# Patient Record
Sex: Female | Born: 1990 | Race: White | Hispanic: No | Marital: Married | Smoking: Never smoker
Health system: Southern US, Community
[De-identification: ages and names within clinical notes are randomized; demographics above are authoritative.]

## PROBLEM LIST (undated history)

## (undated) DIAGNOSIS — N3000 Acute cystitis without hematuria: Principal | ICD-10-CM

## (undated) DIAGNOSIS — R350 Frequency of micturition: Principal | ICD-10-CM

## (undated) DIAGNOSIS — B379 Candidiasis, unspecified: Secondary | ICD-10-CM

## (undated) DIAGNOSIS — R110 Nausea: Principal | ICD-10-CM

## (undated) DIAGNOSIS — N760 Acute vaginitis: Principal | ICD-10-CM

## (undated) DIAGNOSIS — M542 Cervicalgia: Secondary | ICD-10-CM

## (undated) DIAGNOSIS — K829 Disease of gallbladder, unspecified: Secondary | ICD-10-CM

## (undated) DIAGNOSIS — R079 Chest pain, unspecified: Secondary | ICD-10-CM

## (undated) DIAGNOSIS — R0602 Shortness of breath: Secondary | ICD-10-CM

## (undated) DIAGNOSIS — R002 Palpitations: Secondary | ICD-10-CM

## (undated) DIAGNOSIS — M25559 Pain in unspecified hip: Secondary | ICD-10-CM

## (undated) DIAGNOSIS — R1310 Dysphagia, unspecified: Secondary | ICD-10-CM

## (undated) DIAGNOSIS — K219 Gastro-esophageal reflux disease without esophagitis: Secondary | ICD-10-CM

## (undated) DIAGNOSIS — E559 Vitamin D deficiency, unspecified: Secondary | ICD-10-CM

## (undated) DIAGNOSIS — I429 Cardiomyopathy, unspecified: Secondary | ICD-10-CM

## (undated) DIAGNOSIS — M1990 Unspecified osteoarthritis, unspecified site: Secondary | ICD-10-CM

## (undated) DIAGNOSIS — F419 Anxiety disorder, unspecified: Secondary | ICD-10-CM

## (undated) DIAGNOSIS — E739 Lactose intolerance, unspecified: Secondary | ICD-10-CM

## (undated) DIAGNOSIS — E282 Polycystic ovarian syndrome: Secondary | ICD-10-CM

## (undated) DIAGNOSIS — Z91018 Allergy to other foods: Secondary | ICD-10-CM

## (undated) DIAGNOSIS — K5900 Constipation, unspecified: Secondary | ICD-10-CM

## (undated) DIAGNOSIS — R32 Unspecified urinary incontinence: Secondary | ICD-10-CM

## (undated) DIAGNOSIS — E785 Hyperlipidemia, unspecified: Secondary | ICD-10-CM

## (undated) DIAGNOSIS — E669 Obesity, unspecified: Secondary | ICD-10-CM

## (undated) DIAGNOSIS — I38 Endocarditis, valve unspecified: Secondary | ICD-10-CM

## (undated) DIAGNOSIS — N6452 Nipple discharge: Secondary | ICD-10-CM

## (undated) DIAGNOSIS — M549 Dorsalgia, unspecified: Secondary | ICD-10-CM

## (undated) DIAGNOSIS — M2550 Pain in unspecified joint: Secondary | ICD-10-CM

## (undated) DIAGNOSIS — F32A Depression, unspecified: Secondary | ICD-10-CM

## (undated) DIAGNOSIS — I771 Stricture of artery: Secondary | ICD-10-CM

## (undated) DIAGNOSIS — K259 Gastric ulcer, unspecified as acute or chronic, without hemorrhage or perforation: Secondary | ICD-10-CM

## (undated) DIAGNOSIS — E119 Type 2 diabetes mellitus without complications: Secondary | ICD-10-CM

## (undated) DIAGNOSIS — B019 Varicella without complication: Secondary | ICD-10-CM

## (undated) DIAGNOSIS — K760 Fatty (change of) liver, not elsewhere classified: Secondary | ICD-10-CM

## (undated) DIAGNOSIS — Z8739 Personal history of other diseases of the musculoskeletal system and connective tissue: Secondary | ICD-10-CM

## (undated) DIAGNOSIS — K589 Irritable bowel syndrome without diarrhea: Secondary | ICD-10-CM

## (undated) HISTORY — DX: Cervicalgia: M54.2

## (undated) HISTORY — DX: Dorsalgia, unspecified: M54.9

## (undated) HISTORY — DX: Unspecified osteoarthritis, unspecified site: M19.90

## (undated) HISTORY — DX: Shortness of breath: R06.02

## (undated) HISTORY — DX: Constipation, unspecified: K59.00

## (undated) HISTORY — DX: Gastro-esophageal reflux disease without esophagitis: K21.9

## (undated) HISTORY — DX: Lactose intolerance, unspecified: E73.9

## (undated) HISTORY — DX: Gastric ulcer, unspecified as acute or chronic, without hemorrhage or perforation: K25.9

## (undated) HISTORY — DX: Irritable bowel syndrome, unspecified: K58.9

## (undated) HISTORY — DX: Disease of gallbladder, unspecified: K82.9

## (undated) HISTORY — DX: Vitamin D deficiency, unspecified: E55.9

## (undated) HISTORY — DX: Unspecified urinary incontinence: R32

## (undated) HISTORY — DX: Type 2 diabetes mellitus without complications: E11.9

## (undated) HISTORY — DX: Depression, unspecified: F32.A

## (undated) HISTORY — DX: Dysphagia, unspecified: R13.10

## (undated) HISTORY — DX: Pain in unspecified joint: M25.50

## (undated) HISTORY — DX: Personal history of other diseases of the musculoskeletal system and connective tissue: Z87.39

## (undated) HISTORY — DX: Hyperlipidemia, unspecified: E78.5

## (undated) HISTORY — PX: NO PAST SURGERIES: SHX2092

## (undated) HISTORY — DX: Palpitations: R00.2

## (undated) HISTORY — DX: Polycystic ovarian syndrome: E28.2

## (undated) HISTORY — DX: Chest pain, unspecified: R07.9

## (undated) HISTORY — DX: Pain in unspecified hip: M25.559

## (undated) HISTORY — DX: Allergy to other foods: Z91.018

## (undated) HISTORY — DX: Varicella without complication: B01.9

## (undated) HISTORY — DX: Obesity, unspecified: E66.9

## (undated) HISTORY — DX: Endocarditis, valve unspecified: I38

## (undated) HISTORY — DX: Anxiety disorder, unspecified: F41.9

---

## 2012-01-28 LAB — CULTURE, HSV

## 2013-03-07 ENCOUNTER — Inpatient Hospital Stay
Admit: 2013-03-07 | Discharge: 2013-03-07 | Disposition: A | Discharge status: Home / Self Care | Attending: Emergency Medicine

## 2013-03-07 MED ORDER — IBUPROFEN 800 MG PO TABS
800 MG | ORAL_TABLET | Freq: Three times a day (TID) | ORAL | Status: DC | PRN
Start: 2013-03-07 — End: 2014-02-10

## 2013-03-07 MED ORDER — OXYCODONE-ACETAMINOPHEN 5-325 MG PO TABS
5-325 MG | ORAL_TABLET | Freq: Four times a day (QID) | ORAL | Status: DC | PRN
Start: 2013-03-07 — End: 2014-02-10

## 2013-03-07 MED ORDER — SULFAMETHOXAZOLE-TRIMETHOPRIM 800-160 MG PO TABS
800-160 MG | ORAL_TABLET | Freq: Two times a day (BID) | ORAL | Status: AC
Start: 2013-03-07 — End: 2013-03-14

## 2013-03-07 MED ORDER — DOXYCYCLINE MONOHYDRATE 100 MG PO TABS
100 MG | ORAL_TABLET | Freq: Two times a day (BID) | ORAL | Status: AC
Start: 2013-03-07 — End: 2013-03-21

## 2013-03-07 MED ORDER — FLUCONAZOLE 150 MG PO TABS
150 MG | ORAL_TABLET | ORAL | Status: DC
Start: 2013-03-07 — End: 2013-10-27

## 2013-03-07 NOTE — ED Provider Notes (Signed)
[place]  eMERGENCY dEPARTMENT eNCOUnter      Pt Name: Kara Freeman  MRN: [id number]  [place] 10/03/1991  Date of evaluation: 03/07/2013  Provider: Cherre Blanc, MD    CHIEF COMPLAINT       Chief Complaint   Patient presents with   ??? Foot Injury     fell injured right foot         HISTORY OF PRESENT ILLNESS  (Location/Symptom, Timing/Onset, Context/Setting, Quality, Duration, Modifying Factors, Severity.)   Kara Freeman is a 22 y.o. female who presents to the emergency department with complaint of right ankle and foot pain.  She states discomfort started yesterday.  She was wearing her ""shoots" when she tripped over a rock.  She sustained a lateral inversion injury to the ankle.  She however has pain and swelling and redness over the dorsum of the foot more suggestive of a tongue, didn't cellulitis.  Increasing pain focal to this area.  She denies any other injury.       Nursing Notes were reviewed.    ALLERGIES     Review of patient's allergies indicates no known allergies.    CURRENT MEDICATIONS       Previous Medications    No medications on file       PAST MEDICAL HISTORY   History reviewed. No pertinent past medical history.    SURGICAL HISTORY     History reviewed. No pertinent past surgical history.      FAMILY HISTORY     History reviewed. No pertinent family history.  No family status information on file.        SOCIAL HISTORY      reports that she has never smoked. She does not have any smokeless tobacco history on file. She reports that she does not drink alcohol or use illicit drugs.    REVIEW OF SYSTEMS    (2-9 systems for level 4, 10 or more for level 5)   Review of Systems   HENT: Negative.    Respiratory: Negative.    Cardiovascular: Negative.    Genitourinary: Negative.    Musculoskeletal: Positive for arthralgias and gait problem.   Neurological: Negative.    Hematological: Negative.    All other systems reviewed and are negative.         Except as noted above the  remainder of the review of systems was reviewed and negative.     PHYSICAL EXAM    (up to 7 for level 4, 8 or more for level 5)   ED Triage Vitals   BP Temp Temp Source Pulse Resp SpO2 Height Weight   03/07/13 1017 03/07/13 1013 03/07/13 1013 03/07/13 1013 03/07/13 1013 03/07/13 1013 -- 03/07/13 1013   134/78 mmHg 98.2 ??F (36.8 ??C) Oral 99 18 99 %  218 lb (98.884 kg)       Physical exam reflects a well-nourished well-hydrated female who is afebrile with otherwise stable vital signs to include pulse ox 99% on room air.  She's not hypoxic.  She is alert conversive and appropriate in behavior.  No evidence of or complaint of injury to the face head neck chest abdomen bilateral upper extremities or left lower extremity with regard to the right foot the dorsum of the foot is erythematous hot and tender.  No obvious deformity or specific point point tenderness.  No neurovascular deficits.  Achilles reflex is intact.  No ankle discomfort with regard  To the medial or lateral malleolus.  No  proximal tibia or fibular discomfort no evidence of any hip or pelvic injury.      DIAGNOSTIC RESULTS       RADIOLOGY:   Non-plain film images such as CT, Ultrasound and MRI are read by the radiologist. Plain radiographic images are visualized and preliminarily interpreted by the emergency physician with the below findings:    Films appear negative for fracture dislocation retained foreign body or other acute traumatic injury to the foot or ankle    Interpretation per the Radiologist below, if available at the time of this note:    XR FOOT RIGHT STANDARD    (Results Pending)             EMERGENCY DEPARTMENT COURSE and DIFFERENTIAL DIAGNOSIS/MDM:   Vitals:    Filed Vitals:    03/07/13 1013 03/07/13 1017   BP:  134/78   Pulse: 99    Temp: 98.2 ??F (36.8 ??C)    TempSrc: Oral    Resp: 18    Weight: 218 lb (98.884 kg)    SpO2: 99%        Imaging studies are reviewed and are negative for traumatic injury.  Ankle is protected with air splint  with regard to ankle sprain injury component of her discomfort.  I suspect however that she has a component of cellulitis over the dorsum of her foot.  She'll be placed on antibiotic coverage with regard to this.  She's discharged home on conservative management for discomfort and inflammation as well as oral antibiotic coverage for presumed cellulitis.  CONSULTS:  None    PROCEDURES:  None    FINAL IMPRESSION      1. Ankle sprain and strain, right, initial encounter    2. Cellulitis and abscess of leg          DISPOSITION/PLAN   DISPOSITION Decision to Discharge    PATIENT REFERRED TO:   [facility] [place] [facility]  [place] [address]    Schedule an appointment as soon as possible for a visit in 3 days        DISCHARGE MEDICATIONS:     New Prescriptions    DOXYCYCLINE (ADOXA) 100 MG TABLET    Take 1 tablet by mouth 2 times daily for 14 days.    IBUPROFEN (ADVIL;MOTRIN) 800 MG TABLET    Take 1 tablet by mouth every 8 hours as needed for Pain.    OXYCODONE-ACETAMINOPHEN (PERCOCET) 5-325 MG PER TABLET    Take 1-2 tablets by mouth every 6 hours as needed for Pain.    SULFAMETHOXAZOLE-TRIMETHOPRIM (BACTRIM DS) 800-160 MG PER TABLET    Take 1 tablet by mouth 2 times daily for 7 days.           (Please note that portions of this note were completed with a voice recognition program.  Efforts were made to edit the dictations but occasionally words are mis-transcribed.)    Cherre Blanc, MD  Attending Emergency Physician          Cherre Blanc, MD  03/07/13 1056

## 2013-03-07 NOTE — Discharge Instructions (Signed)
Ankle Sprain  An ankle sprain happens when the bands of tissue that hold the ankle bones together (ligaments) stretch too much and tear.   HOME CARE    Put ice on the ankle for 15 to 20 minutes, 3 to 4 times a day.   Put ice in a plastic bag.   Place a towel between your skin and the bag.   You may stop icing when the puffiness (swelling) goes down.   Raise (elevate) the injured ankle to lessen puffiness.   Use crutches if your doctor tells you to. Use them until you can walk without pain.   If a plaster splint was applied:   Rest the plaster splint on nothing harder than a pillow for 24 hours.   Do not put weight on it.   Do not get it wet.   Take it off to shower or bathe.   Follow up with your doctor.   Use an elastic wrap for support. Take the wrap off if the toes lose feeling (numb), tingle, or turn cold or blue.   If an air splint was applied:   Add or release air to make it comfortable.   Take it off at night and to shower and bathe.   Wiggle your toes while wearing the air splint.   Only take medicine as told by your doctor.   Do not drive until your doctor says it is okay.  GET HELP RIGHT AWAY IF:    You have more bruising, puffiness, or pain.   Your toes feel cold.   Your medicine does not help lessen your pain.   You are losing feeling in your toes or they turn blue.   You have severe pain.  MAKE SURE YOU:    Understand these instructions.   Will watch your condition.   Will get help right away if you are not doing well or get worse.  Document Released: 04/02/2008 Document Revised: 01/07/2012 Document Reviewed: 04/02/2008  [facility] Patient Information 2013 [place].    Cellulitis  Cellulitis is an infection of the skin and the tissue under the skin. The infected area is usually red and tender. This happens most often in the arms and lower legs.  HOME CARE    Take your antibiotic medicine as told. Finish the medicine even if you start to feel better.   Keep the infected  arm or leg raised (elevated).   Put a warm cloth on the area up to 4 times per day.   Only take medicines as told by your doctor.   Keep all doctor visits as told.  GET HELP RIGHT AWAY IF:    You have a fever.   You feel very sleepy.   You throw up (vomit) or have watery poop (diarrhea).   You feel sick and have muscle aches and pains.   You see red streaks on the skin coming from the infected area.   Your red area gets bigger or turns a dark color.   Your bone or joint under the infected area is painful after the skin heals.   Your infection comes back in the same area or different area.   You have a puffy (swollen) bump in the infected area.   You have new symptoms.  MAKE SURE YOU:    Understand these instructions.   Will watch your condition.   Will get help right away if you are not doing well or get worse.  Document Released: 04/02/2008 Document Revised:  04/15/2012 Document Reviewed: 12/31/2011  [facility] Patient Information 2013 [place].

## 2013-03-07 NOTE — ED Notes (Signed)
Pt c/o pain to rt foot after yesterday tripping over rock.has mild swelling and redness to top of rt foot.pt able to ambulate with limp.    Kathrene Alu, RN  03/07/13 [phone]

## 2013-03-07 NOTE — ED Notes (Signed)
As per Dr Erline Levine.the patient refused crutches    Lara Mulch, LPN  [id number]

## 2013-05-04 MED ORDER — SILVER SULFADIAZINE 1 % EX CREA
1 % | CUTANEOUS | Status: DC
Start: 2013-05-04 — End: 2014-02-10

## 2013-05-04 NOTE — Progress Notes (Signed)
Kara Freeman 22 y.o. female that presents complaining of a painful infected ingrown toenail on the 1st Left and Right toes. Conservative therapy has failed    Vascular: DP and PT pulses palpable Right Foot 2/4,DP and PT pulses palpable Left Foot 2/4,        Neurological:  Sensation intact to light touch to level of digits, both feet.         Musculoskeletal    Pain present upon palpation of hallux nail, Right Foot, present upon palpation of hallux nail, Left Foot..    Integument:    Warm, dry, supple, present Right Foot, present Left Foot.      Nails 1 left and 1 right thickened >  dystrophic and crumbly, discolored with subungual debris.  Ingrown toenail with infection 1st Left and Right    Assessment:   Ingrown toenail with infection 1st Left and Right    Plan: Patient has chosen permanent removal of the entire toenail. The procedure, possible complications have been discussed. Patient was informed of the recurrence rate .       Phenol and alcohol matrixectomy was performed to the offending nail. Standard foot prep was done and a  hallux digital block was performed utilizing 1% Lidocain plain. A digital tourniquet was applied to the appropriate digit. The toenail was freed from the surrounding tissue and it was completely removed. Three consecutive 30 seconds applications of 89% phenol were then applied to the nail matrix with an applicator. Next utilizing a small curette the matrix epithelium was scraped to further destroy the matrix. The entire field was then lavaged with 70% isopropyl alcohol to flush the remaining phenol from the tissue. The site was then dressed with Silvadene cream and a dry sterile dressing. The patient was educated on signs of infection both in written and verbal format. There also gave been written and oral instructions on care over the next two weeks. They were told to call with any questions, comments, or concerns.    Perm removal left 1 and temporary removal right 1Plan: The toe was  anesthestized with 5cc's of Lidocaine plain, The toe was prepped and draped in a sterile fashion. Bleeding was controlled with a toe tourniquet. The nail was removed completely to the level of the matrix. The tourniquet was released and a sterile dressing was applied. The patient tolerated the procedure well without apparent complications. Oral and written instructions were dispensed.

## 2013-05-18 NOTE — Progress Notes (Signed)
Kara Freeman is a 22 y.o. female with the chief complaint of left great ingrown post op. It has been present for 2 week(s) duration.  Review of systems  Denies n/v/f/c and calf pain  Physical Exam  General AAOx3 NAD  Lower extremity physical exam:    Vascular:  DP/PT pulses are palpable and are 2/4. Marland Kitchen No edema or varicosities.CFT brisk to all digits. Skin temperature is warm to warm.     Neurology: Sensation is normal. Reflexes are within normal limits.Sharp and dull discrimination intact    Orthopedic: Joint range of motion is normal. Muscle strength is normal. No structural deformities.    Dermatology: Inspection of skin and all tissues are normal. Nails are normal. There are no open lesions. No tenting of skin. No fracture blisters. No ecchymosis.       Assessment:    Infected ingrown nail right hallux    Plan:    Total perm. Right hallux      Phenol and alcohol matrixectomy was performed to the offending nail. Standard foot prep was done and a  hallux digital block was performed utilizing 1% Lidocain plain. A digital tourniquet was applied to the appropriate digit. The toenail was freed from the surrounding tissue and it was completely removed. Three consecutive 30 seconds applications of 89% phenol were then applied to the nail matrix with an applicator. Next utilizing a small curette the matrix epithelium was scraped to further destroy the matrix. The entire field was then lavaged with 70% isopropyl alcohol to flush the remaining phenol from the tissue. The site was then dressed with Silvadene cream and a dry sterile dressing. The patient was educated on signs of infection both in written and verbal format. There also gave been written and oral instructions on care over the next two weeks. They were told to call with any questions, comments, or concerns.

## 2013-06-12 ENCOUNTER — Inpatient Hospital Stay
Admit: 2013-06-12 | Discharge: 2013-06-12 | Disposition: A | Discharge status: Home / Self Care | Attending: Personal Emergency Response Attendant

## 2013-06-12 MED ORDER — CYCLOBENZAPRINE HCL 10 MG PO TABS
10 MG | ORAL_TABLET | Freq: Three times a day (TID) | ORAL | Status: AC | PRN
Start: 2013-06-12 — End: 2013-06-22

## 2013-06-12 NOTE — ED Provider Notes (Signed)
eMERGENCY dEPARTMENT eNCOUnter        CHIEF COMPLAINT    Chief Complaint   Patient presents with   ??? Back Pain       HPI    Kara Freeman is a 22 y.o. female who presents with complaints of back pain.  Patient was moving something and pulled her back and has pain across the lower back, not radiating anywhere.  No bowel or bladder involvement no tingling and numbness of the toes.  No abdominal pain.  No neck pain    REVIEW OF SYSTEMS    See HPI for further details. Review of systems otherwise negative.     PAST MEDICAL HISTORY    Past Medical History   Diagnosis Date   ??? Herpes        SURGICAL HISTORY    History reviewed. No pertinent past surgical history.    CURRENT MEDICATIONS    Current Outpatient Rx   Name  Route  Sig  Dispense  Refill   ??? cyclobenzaprine (FLEXERIL) 10 MG tablet    Oral    Take 1 tablet by mouth 3 times daily as needed for Muscle spasms for up to 10 days.    30 tablet    0     ??? silver sulfADIAZINE (SILVADENE) 1 % cream        Apply topically twice a day    50 g    1     ??? ibuprofen (ADVIL;MOTRIN) 800 MG tablet    Oral    Take 1 tablet by mouth every 8 hours as needed for Pain.    30 tablet    0     ??? oxyCODONE-acetaminophen (PERCOCET) 5-325 MG per tablet    Oral    Take 1-2 tablets by mouth every 6 hours as needed for Pain.    20 tablet    0     ??? fluconazole (DIFLUCAN) 150 MG tablet    Oral    Take 1 tablet by mouth every 72 hours.    3 tablet    0     ??? ValACYclovir HCl (VALTREX PO)    Oral    Take 400 mg by mouth 2 times daily as needed.                 ALLERGIES    No Known Allergies    FAMILY HISTORY    History reviewed. No pertinent family history.    SOCIAL HISTORY    History     Social History   ??? Marital Status: Single     Spouse Name: N/A     Number of Children: N/A   ??? Years of Education: N/A     Social History Main Topics   ??? Smoking status: Never Smoker    ??? Smokeless tobacco: None   ??? Alcohol Use: No   ??? Drug Use: No   ??? Sexual Activity: None     Other Topics Concern   ??? None      Social History Narrative       PHYSICAL EXAM    VITAL SIGNS: BP 133/73   Pulse 77   Temp(Src) 98.1 ??F (36.7 ??C) (Oral)   Resp 16   Ht 5\' 6"  (1.676 m)   Wt 221 lb 11.2 oz (100.562 kg)   BMI 35.8 kg/m2   SpO2 99%   Constitutional:  Well developed, well nourished, no acute distress, non-toxic appearance HENT:  Atraumatic, external ears normal, nose normal, oropharynx  moist. Neck- normal range of motion, no tenderness, supple   Respiratory:  No respiratory distress, normal breath sounds.   Cardiovascular:  Normal rate, normal rhythm, no murmurs, no gallops, no rubs   GI:  Soft, nondistended, nontender, no organomegaly, no mass, no rebound, no guarding   GU:  No costovertebral angle tenderness   Musculoskeletal:  No edema, no tenderness, no deformities.  Back- local tenderness in the lower lumbar spine noted   Integument:  Well hydrated   Neurologic:  No neuro deficit     EKG    Not clinically indicated.      RADIOLOGY/PROCEDURES    Not clinically indicated.      ED COURSE & MEDICAL DECISION MAKING    Pertinent Labs & Imaging studies reviewed. (See chart for details)  22 year old with back pain    FINAL IMPRESSION    1.  Back pain    Christie Nottingham, MD  06/12/13 [phone]

## 2013-06-12 NOTE — Discharge Instructions (Signed)
Back Pain, Adult  Low back pain is very common. About 1 in 5 people have back pain.The cause of low back pain is rarely dangerous. The pain often gets better over time.About half of people with a sudden onset of back pain feel better in just 2 weeks. About 8 in 10 people feel better by 6 weeks.   CAUSES  Some common causes of back pain include:   Strain of the muscles or ligaments supporting the spine.   Wear and tear (degeneration) of the spinal discs.   Arthritis.   Direct injury to the back.  DIAGNOSIS  Most of the time, the direct cause of low back pain is not known.However, back pain can be treated effectively even when the exact cause of the pain is unknown.Answering your caregiver's questions about your overall health and symptoms is one of the most accurate ways to make sure the cause of your pain is not dangerous. If your caregiver needs more information, he or she may order lab work or imaging tests (X-rays or MRIs).However, even if imaging tests show changes in your back, this usually does not require surgery.  HOME CARE INSTRUCTIONS  For many people, back pain returns.Since low back pain is rarely dangerous, it is often a condition that people can learn to manageon their own.    Remain active. It is stressful on the back to sit or stand in one place. Do not sit, drive, or stand in one place for more than 30 minutes at a time. Take short walks on level surfaces as soon as pain allows.Try to increase the length of time you walk each day.   Do not stay in bed.Resting more than 1 or 2 days can delay your recovery.   Do not avoid exercise or work.Your body is made to move.It is not dangerous to be active, even though your back may hurt.Your back will likely heal faster if you return to being active before your pain is gone.   Pay attention to your body when you bend and lift. Many people have less discomfortwhen lifting if they bend their knees, keep the load close to their bodies,and  avoid twisting. Often, the most comfortable positions are those that put less stress on your recovering back.   Find a comfortable position to sleep. Use a firm mattress and lie on your side with your knees slightly bent. If you lie on your back, put a pillow under your knees.   Only take over-the-counter or prescription medicines as directed by your caregiver. Over-the-counter medicines to reduce pain and inflammation are often the most helpful.Your caregiver may prescribe muscle relaxant drugs.These medicines help dull your pain so you can more quickly return to your normal activities and healthy exercise.   Put ice on the injured area.   Put ice in a plastic bag.   Place a towel between your skin and the bag.   Leave the ice on for 15-20 minutes, 3-4 times a day for the first 2 to 3 days. After that, ice and heat may be alternated to reduce pain and spasms.   Ask your caregiver about trying back exercises and gentle massage. This may be of some benefit.   Avoid feeling anxious or stressed.Stress increases muscle tension and can worsen back pain.It is important to recognize when you are anxious or stressed and learn ways to manage it.Exercise is a great option.  SEEK MEDICAL CARE IF:   You have pain that is not relieved with rest or   medicine.   You have pain that does not improve in 1 week.   You have new symptoms.   You are generally not feeling well.  SEEK IMMEDIATE MEDICAL CARE IF:    You have pain that radiates from your back into your legs.   You develop new bowel or bladder control problems.   You have unusual weakness or numbness in your arms or legs.   You develop nausea or vomiting.   You develop abdominal pain.   You feel faint.  Document Released: 10/15/2005 Document Revised: 04/15/2012 Document Reviewed: 03/05/2011  [employer] Patient Information 2014 [employer].

## 2013-06-20 ENCOUNTER — Inpatient Hospital Stay: Admit: 2013-06-20 | Discharge: 2013-06-20 | Discharge status: Against Medical Advice

## 2013-06-20 LAB — VAGINITIS DNA PROBE
Direct Exam: 407.3
Direct Exam: NEGATIVE
Direct Exam: POSITIVE — AB
Direct Exam: POSITIVE — AB

## 2013-06-20 NOTE — ED Notes (Signed)
Pt presents to er with c/o vaginal d/c with odor. Pt states she was called last week per her ob and was told she has std. Pt was treated with "2 pills". Pt states she still has s/sx. Pt a&ox3. Skin warm and dry. Respirations even and non-labored.     Charm RingsBobbie Wells Mabe, RN  06/20/13 1124

## 2013-06-20 NOTE — ED Notes (Signed)
Pt states she has to leave for work. Pt states she does not have to to wait for results. E. Lump C-NP notified.     Charm RingsBobbie Derrica Sieg, RN  06/20/13 1204

## 2013-06-20 NOTE — ED Provider Notes (Signed)
eMERGENCY dEPARTMENT eNCOUnter        [place]    Chief Complaint   Patient presents with   ??? Vaginal Discharge     past month..tx for chlamydia 2wks ago       HPI    Kara Freeman is a 22 y.o. female who presents With complaints of vaginal discharge with odor.  She was seen at her ObGyn's office and diagnosed with Chlamydia one week ago.  She took her antibiotic as prescribed but continues to have vaginal discharge with odor.  She denies any abdominal pain or nausea.  She denies any fevers at home.  No dysuria or urinary frequency.  No chest pain or shortness of breath.    REVIEW OF SYSTEMS    See HPI for further details. Review of systems otherwise negative.     PAST MEDICAL HISTORY    Past Medical History   Diagnosis Date   ??? Herpes        SURGICAL HISTORY    No past surgical history on file.    CURRENT MEDICATIONS    Current Outpatient Rx   Name  Route  Sig  Dispense  Refill   ??? ValACYclovir HCl (VALTREX PO)    Oral    Take 400 mg by mouth 2 times daily as needed.             ??? cyclobenzaprine (FLEXERIL) 10 MG tablet    Oral    Take 1 tablet by mouth 3 times daily as needed for Muscle spasms for up to 10 days.    30 tablet    0     ??? silver sulfADIAZINE (SILVADENE) 1 % cream        Apply topically twice a day    50 g    1     ??? ibuprofen (ADVIL;MOTRIN) 800 MG tablet    Oral    Take 1 tablet by mouth every 8 hours as needed for Pain.    30 tablet    0     ??? oxyCODONE-acetaminophen (PERCOCET) 5-325 MG per tablet    Oral    Take 1-2 tablets by mouth every 6 hours as needed for Pain.    20 tablet    0     ??? fluconazole (DIFLUCAN) 150 MG tablet    Oral    Take 1 tablet by mouth every 72 hours.    3 tablet    0         ALLERGIES    No Known Allergies    FAMILY HISTORY    No family history on file.    SOCIAL HISTORY    History     Social History   ??? Marital Status: Single     Spouse Name: N/A     Number of Children: N/A   ??? Years of Education: N/A     Social History Main Topics   ??? Smoking status: Never Smoker     ??? Smokeless tobacco: None   ??? Alcohol Use: No   ??? Drug Use: No   ??? Sexual Activity: None     Other Topics Concern   ??? None     Social History Narrative       PHYSICAL EXAM    VITAL SIGNS: BP 135/66   Pulse 93   Temp(Src) 98.2 ??F (36.8 ??C) (Oral)   Resp 18   Ht 5' 6"$  (1.676 m)   Wt 222 lb 3.6 oz (100.8 kg)  BMI 35.88 kg/m2   SpO2 99%  Constitutional:  Well developed, well nourished, no acute distress, non-toxic appearance   Eyes:  PERRL, conjunctiva normal   HENT:  Atraumatic, external ears normal, nose normal, oropharynx moist.  Neck- supple   Respiratory:  No respiratory distress, normal breath sounds   Cardiovascular:  Normal rate, normal rhythm   GI:  Abdomen is soft and nontender.  She has normal bowel sounds present in all 4 quadrants.   GU:  No adnexal or cervical motion tenderness.  Labia minora and vaginal walls are red and irritated without any lesions or ulcers.  She has a moderate amount of thick white discharge present  Integument:  Well hydrated       ED COURSE & MEDICAL DECISION MAKING    Pertinent Labs & Imaging studies reviewed. (See chart for details)  Urine was negative for pregnancy.  Pelvic labs were obtained however the patient received an urgent call from her employer and she had to leave AMA. Her results were not yet available.  She was unable to stay for discharge paperwork but did sign AMA paperwork with her nurse.    FINAL IMPRESSION    1.  vaginitis  2.  AMA      Sharyon Medicus, NP  06/20/13 [place], NP  06/20/13 1540

## 2013-06-22 LAB — C. TRACHOMATIS / N. GONORRHOEAE, DNA
C. trachomatis DNA: POSITIVE — AB
N. gonorrhoeae DNA: NEGATIVE

## 2013-08-30 ENCOUNTER — Inpatient Hospital Stay
Admit: 2013-08-30 | Discharge: 2013-08-30 | Disposition: A | Discharge status: Home / Self Care | Attending: Emergency Medicine

## 2013-08-30 LAB — MICROSCOPIC URINALYSIS
RBC, UA: 10 /HPF (ref 0–2)
WBC, UA: 0 /HPF (ref 0–5)

## 2013-08-30 LAB — VAGINITIS DNA PROBE
Direct Exam: 407.3
Direct Exam: NEGATIVE
Direct Exam: POSITIVE — AB
Direct Exam: POSITIVE — AB

## 2013-08-30 LAB — URINALYSIS
Bilirubin Urine: NEGATIVE
Glucose, Ur: NEGATIVE
Ketones, Urine: NEGATIVE
Nitrite, Urine: NEGATIVE
Protein, UA: NEGATIVE
Specific Gravity, UA: 1.025 (ref 1.005–1.030)
Urobilinogen, Urine: NORMAL
pH, UA: 6.5 (ref 5.0–8.0)

## 2013-08-30 LAB — POCT URINE PREGNANCY: Preg Test, Ur: NEGATIVE

## 2013-08-30 MED ORDER — DOXYCYCLINE HYCLATE 100 MG PO TABS
100 MG | ORAL_TABLET | Freq: Two times a day (BID) | ORAL | Status: DC
Start: 2013-08-30 — End: 2014-02-10

## 2013-08-30 MED ORDER — VALACYCLOVIR HCL 1 G PO TABS
1 g | ORAL_TABLET | Freq: Three times a day (TID) | ORAL | Status: AC
Start: 2013-08-30 — End: 2013-09-06

## 2013-08-30 MED ORDER — METRONIDAZOLE 500 MG PO TABS
500 MG | ORAL_TABLET | Freq: Three times a day (TID) | ORAL | Status: AC
Start: 2013-08-30 — End: 2013-09-09

## 2013-08-30 MED ORDER — FLUCONAZOLE 150 MG PO TABS
150 MG | ORAL_TABLET | Freq: Every day | ORAL | Status: DC
Start: 2013-08-30 — End: 2013-10-27

## 2013-08-30 MED ADMIN — cefTRIAXone (ROCEPHIN) injection 1 g: INTRAMUSCULAR | @ 21:00:00 | NDC 00781320885

## 2013-08-30 MED FILL — CEFTRIAXONE SODIUM 1 G IJ SOLR: 1 g | INTRAMUSCULAR | Qty: 1

## 2013-08-30 NOTE — Discharge Instructions (Signed)
Candidiasis: After Your Visit  Your Care Instructions  Candidiasis (say "kan-dih-DY-uh-sus") is a yeast infection. Yeast normally lives in your body. But it can cause problems if your body's defenses don't work as they should.  Some medicines can increase your chance of getting a yeast infection. These include antibiotics, steroids, and cancer drugs. And some diseases like AIDS and diabetes can make you more likely to get yeast infections.  There are different types of yeast infections.  Kara Freeman is a yeast infection in the mouth. It usually occurs in people with weak immune systems. It causes white patches inside the mouth and throat.  Yeast infections of the skin usually occur in skin folds where the skin stays moist. They cause red, oozing patches on your skin. Babies can get these infections under the diaper. People who often wear gloves can get them on their hands.  Many women get vaginal yeast infections. They are most common when women take antibiotics. These infections can cause the vagina to itch and burn. They also cause white discharge that looks like cottage cheese.  In rare cases, yeast infects the blood. This can cause serious disease. This kind of infection is treated with medicine given through a needle into a vein (IV).  After you start treatment, a yeast infection usually goes away quickly. But if your immune system is weak, the infection may come back. Tell your doctor if you get yeast infections often.  Follow-up care is a key part of your treatment and safety. Be sure to make and go to all appointments, and call your doctor if you are having problems. It's also a good idea to know your test results and keep a list of the medicines you take.  How can you care for yourself at home?   Take your medicines exactly as prescribed. Call your doctor if you think you are having a problem with your medicine.   Use antibiotics only as directed by your doctor.   Eat yogurt with live cultures. It has  bacteria called lactobacillus. It may help prevent some types of yeast infections.   Keep your skin clean and dry. Put powder on moist places.   If you are using a cream or suppository to treat a vaginal yeast infection, don't use condoms or a diaphragm. Use a different type of birth control.   Eat a healthy diet and get regular exercise. This will help keep your immune system strong.  When should you call for help?  Call your doctor now or seek immediate medical care if:   You have a fever.   You are pregnant and have signs of a vaginal or urinary tract infection such as:   Severe itching in your vagina.   Pain during sex or when you urinate.   Unusual discharge from your vagina.   A frequent urge to urinate.   Urine that is cloudy or smells bad.  Watch closely for changes in your health, and be sure to contact your doctor if:   You do not get better as expected.   Where can you learn more?   Go to [email or web address] and sign in to your MyChart account. Enter [phone] in the [place] box to learn more about "Candidiasis: After Your Visit."    If you do not have an account, please click on the "Sign Up Now" link.      2006-2014 Healthwise, Incorporated. Care instructions adapted under license by [facility]. This care instruction is for use with  your licensed healthcare professional. If you have questions about a medical condition or this instruction, always ask your healthcare professional. [place] any warranty or liability for your use of this information.  Content Version: 10.1.311062; Current as of: January 07, 2013                Vaginitis: After Your Visit  Your Care Instructions  Vaginitis is soreness or infection of the vagina. This common problem can cause itching and burning. And it can cause a change in vaginal discharge. Sometimes it can cause pain during sex. Vaginitis may be caused by bacteria, yeast, or other germs.  Some infections that cause it are caught from a sexual partner. Bath products, spermicides, and douches can irritate the vagina too.  Some women have this problem during and after menopause. A drop in estrogen levels during this time can cause dryness, soreness, and pain during sex.  Your doctor can give you medicine to treat an infection. And home care may help you feel better. For certain types of infections, your sex partner must be treated too.  Follow-up care is a key part of your treatment and safety. Be sure to make and go to all appointments, and call your doctor if you are having problems. It's also a good idea to know your test results and keep a list of the medicines you take.  How can you care for yourself at home?   If your doctor prescribed antibiotics, take them as directed. Do not stop taking them just because you feel better. You need to take the full course of antibiotics.   Take your medicines exactly as prescribed. Call your doctor if you think you are having a problem with your medicine.   Do not eat or drink anything that has alcohol if you are taking metronidazole (Flagyl).   If you have a yeast infection, use over-the-counter products as your doctor tells you to. Or take medicine your doctor prescribes exactly as directed.   Wash your vaginal area daily with water. You also can use a mild, unscented soap if you want.   Do not use scented bath products. And do not use vaginal sprays or douches.   Put a washcloth soaked in cool water on the area to relieve itching. Or you can take cool baths.   If you have dryness because of menopause, use estrogen cream or pills that your doctor prescribes.   Ask your doctor about when it is okay to have sex.   Use a personal lubricant before sex if you have dryness. Examples are Astroglide, K-Y Jelly, and Wet Lubricant Gel.   Ask your doctor if your sex partner also needs treatment.  When should you call for help?  Call your doctor now or seek  immediate medical care if:   You have a fever and pelvic pain.  Watch closely for changes in your health, and be sure to contact your doctor if:   You have bleeding other than your period.   You do not get better as expected.   Where can you learn more?   Go to [email or web address] and sign in to your MyChart account. Enter [phone] in the [place] box to learn more about "Vaginitis: After Your Visit."    If you do not have an account, please click on the "Sign Up Now" link.      2006-2014 Healthwise, Incorporated. Care instructions adapted under license by [facility]. This care instruction is for use  with your licensed healthcare professional. If you have questions about a medical condition or this instruction, always ask your healthcare professional. [place] any warranty or liability for your use of this information.  Content Version: 10.1.311062; Current as of: January 07, 2013

## 2013-08-30 NOTE — ED Notes (Signed)
"  I'm having a breakout of herpes" "doubled up on valtrex also taking diflucan and using monistat" continues with vaginal itching. No abd pain or urinary c/o. abd obese soft. Denies fever.    Candelaria Celeste, RN  08/30/13 [phone]

## 2013-08-30 NOTE — ED Provider Notes (Signed)
[facility] EMERGENCY DEPARTMENT  eMERGENCY dEPARTMENT eNCOUnter      Pt Name: Kara Freeman  MRN: [id number]  Birthdate January 21, 1991  Date of evaluation: 08/30/2013  Provider: Fabian Sharp, MD    CHIEF COMPLAINT       Chief Complaint   Patient presents with   ??? Vaginal Itching         HISTORY OF PRESENT ILLNESS  (Location/Symptom, Timing/Onset, Context/Setting, Quality, Duration, Modifying Factors, Severity.)   Kara Freeman is a 22 y.o. female who presents to the emergency department with complaint of vaginal discharge and discomfort.  Patient has been treatment for vaginitis and herpes.  She's been taking Diflucan although last dose was 3 weeks ago.  She has been on Valtrex.  She is utilized Chief of Staff.  Her symptoms continued to worsen       Nursing Notes were reviewed.    ALLERGIES     Review of patient's allergies indicates no known allergies.    CURRENT MEDICATIONS       Previous Medications    FLUCONAZOLE (DIFLUCAN) 150 MG TABLET    Take 1 tablet by mouth every 72 hours.    IBUPROFEN (ADVIL;MOTRIN) 800 MG TABLET    Take 1 tablet by mouth every 8 hours as needed for Pain.    OXYCODONE-ACETAMINOPHEN (PERCOCET) 5-325 MG PER TABLET    Take 1-2 tablets by mouth every 6 hours as needed for Pain.    SILVER SULFADIAZINE (SILVADENE) 1 % CREAM    Apply topically twice a day    VALACYCLOVIR HCL (VALTREX PO)    Take 400 mg by mouth 2 times daily as needed.       PAST MEDICAL HISTORY         Diagnosis Date   ??? Herpes        SURGICAL HISTORY     History reviewed. No pertinent past surgical history.      FAMILY HISTORY     History reviewed. No pertinent family history.  No family status information on file.        SOCIAL HISTORY      reports that she has never smoked. She does not have any smokeless tobacco history on file. She reports that she does not drink alcohol or use illicit drugs.    REVIEW OF SYSTEMS    (2-9 systems for level 4, 10 or more for level 5)     Review of Systems   Constitutional: Negative.    HENT: Negative.     Respiratory: Negative.    Cardiovascular: Negative.    Gastrointestinal: Negative.    Genitourinary: Positive for vaginal discharge and vaginal pain.   Neurological: Negative.    Hematological: Negative.         Except as noted above the remainder of the review of systems was reviewed and negative.     PHYSICAL EXAM    (up to 7 for level 4, 8 or more for level 5)     Filed Vitals:    08/30/13 1537   BP: 129/73   Pulse: 89   Temp: 97.7 ??F (36.5 ??C)   TempSrc: Oral   Resp: 16   Weight: 221 lb 12.5 oz (100.6 kg)   SpO2: 98%       Physical exam reflects a well-nourished well-hydrated female who is afebrile with otherwise stable vital signs to include pulse ox 98% on room air.  She is not hypoxic.  She is alert conversive and appropriate behavior.  Oropharyngeal exam without lesion.  Heart regular rate and rhythm normal S1-S2 no murmurs rubs or gallops.  Lungs are clear to auscultation without wheezes rales or rhonchi.  Abdominal exam is benign soft throughout no focal tenderness rebound or guarding.  Pelvic exam shows significant erythema and warmth to the labial tissue.  She has a thick vaginal discharge noted, she does have a curdish component consistent with yeast.  I do not appreciate vesicular lesions.  She does have cervical motion tenderness.  No adnexal enlargement or tenderness noted      DIAGNOSTIC RESULTS         LABS:  Labs Reviewed   URINE CULTURE CLEAN CATCH   VAGINITIS DNA PROBE   C. TRACHOMATIS / N. GONORRHOEAE, DNA PROBE   URINALYSIS   POCT URINE PREGNANCY     Results for Kara Freeman (MRN [id number]) as of 08/30/2013 16:07   Ref. Range 08/30/2013 15:59 08/30/2013 16:00   Pregnancy, Urine No range found negative      All other labs were within normal range or not returned as of this dictation.    EMERGENCY DEPARTMENT COURSE and DIFFERENTIAL DIAGNOSIS/MDM:   Vitals:    Filed Vitals:    08/30/13 1537   BP: 129/73   Pulse: 89   Temp: 97.7 ??F (36.5 ??C)   TempSrc: Oral   Resp: 16   Weight: 221 lb 12.5 oz (100.6  kg)   SpO2: 98%     Patient is evaluated.  She is treated here with intramuscular Rocephin and oral Diflucan.  She will be discharged home on extended course of Diflucan with addition of Flagyl.  Doxycycline added with regard to appearance of secondary bacterial infection in conjunction with pending pelvic cultures.  Her Valtrex is refilled.  She is advised follow-up with her treating ObGyn or family physician.  She is discharged in good condition    CONSULTS:  None    PROCEDURES:  None    FINAL IMPRESSION      1. Vaginitis and vulvovaginitis, unspecified    2. Cellulitis    3. Monilial vulvovaginitis          DISPOSITION/PLAN   DISPOSITION Decision to Discharge    PATIENT REFERRED TO:   [facility]  [address]  [place] [address]          [facility]  [address] [place] [address]  [phone]    As needed, If symptoms worsen      DISCHARGE MEDICATIONS:     New Prescriptions    DOXYCYCLINE (VIBRA-TABS) 100 MG TABLET    Take 1 tablet by mouth 2 times daily.    FLUCONAZOLE (DIFLUCAN) 150 MG TABLET    Take 1 tablet by mouth daily.    METRONIDAZOLE (FLAGYL) 500 MG TABLET    Take 1 tablet by mouth 3 times daily for 10 days.    VALACYCLOVIR (VALTREX) 1 G TABLET    Take 1 tablet by mouth 3 times daily for 7 days.           (Please note that portions of this note were completed with a voice recognition program.  Efforts were made to edit the dictations but occasionally words are mis-transcribed.)    Fabian Sharp, MD  Attending Emergency Physician          Fabian Sharp, MD  08/30/13 [phone]

## 2013-08-31 LAB — C. TRACHOMATIS / N. GONORRHOEAE, DNA
C. trachomatis DNA: NEGATIVE
N. gonorrhoeae DNA: NEGATIVE

## 2013-09-01 LAB — URINE CULTURE CLEAN CATCH

## 2013-10-27 NOTE — ED Provider Notes (Signed)
Attending Supervisory Note/Shared Visit   I have personally performed a face to face diagnostic evaluation on this patient. I have reviewed the mid-level???s findings and agree.      (Please note that portions of this note were completed with a voice recognition program.  Efforts were made to edit the dictations but occasionally words are mis-transcribed.)    Wess Botts, MD  Attending Emergency Physician      Wess Botts, MD  10/27/13 [phone]

## 2013-10-27 NOTE — Discharge Instructions (Signed)
Packing removal in 24 hours then warm compresses  Skin Abscess: After Your Visit  Your Care Instructions     A skin abscess is a bacterial infection that forms a pocket of pus. A boil is a kind of skin abscess. The doctor may have cut an opening in the abscess so that the pus can drain out. You may have gauze in the cut so that the abscess will stay open and keep draining. You may need antibiotics. You will need to follow up with your doctor to make sure the infection has gone away.  The doctor has checked you carefully, but problems can develop later. If you notice any problems or new symptoms, get medical treatment right away.  Follow-up care is a key part of your treatment and safety. Be sure to make and go to all appointments, and call your doctor if you are having problems. It's also a good idea to know your test results and keep a list of the medicines you take.  How can you care for yourself at home?   Apply warm and dry compresses, a heating pad set on low, or a hot water bottle 3 or 4 times a day for pain. Keep a cloth between the heat source and your skin.   If your doctor prescribed antibiotics, take them as directed. Do not stop taking them just because you feel better. You need to take the full course of antibiotics.   Take pain medicines exactly as directed.   If the doctor gave you a prescription medicine for pain, take it as prescribed.   If you are not taking a prescription pain medicine, ask your doctor if you can take an over-the-counter medicine.   Keep your bandage clean and dry. Change the bandage whenever it gets wet or dirty, or at least one time a day.   If the abscess was packed with gauze:   Keep follow-up appointments to have the gauze changed or removed. If the doctor instructed you to remove the gauze, gently pull out all of the gauze when your doctor tells you to.   After the gauze is removed, soak the area in warm water for 15 to 20 minutes 2 times a day, until the wound  closes.  When should you call for help?  Call your doctor now or seek immediate medical care if:   You have signs of worsening infection, such as:   Increased pain, swelling, warmth, or redness.   Red streaks leading from the infected skin.   Pus draining from the wound.   A fever.  Watch closely for changes in your health, and be sure to contact your doctor if:   You do not get better as expected.   Where can you learn more?   Go to [email or web address] and sign in to your MyChart account. Enter [phone] in the [place] box to learn more about "Skin Abscess: After Your Visit."    If you do not have an account, please click on the "Sign Up Now" link.      2006-2014 Healthwise, Incorporated. Care instructions adapted under license by [facility]. This care instruction is for use with your licensed healthcare professional. If you have questions about a medical condition or this instruction, always ask your healthcare professional. [place] any warranty or liability for your use of this information.  Content Version: 10.3.368381; Current as of: January 07, 2013            [facility]  [place] [facility]  [address]  [phone] Pediatric Primary Care  Adult Primary Care  OB/GYN/Prenatal/Specialty Clinics Monday - Friday  8a - 430p   [facility]  [phone] Assistance with applying for chronic long term meds (high BP, Diabetes, etc), offered thru programs made available by various pharmaceutical companies Monday - Friday  Call to make an appointment   [facility]  [address]  [phone] Pediatrics and [facility] Practice Monday - Friday  Kara Freeman  [id number]  [phone] Adult Medicine, Pediatrics, OB/GYN Monday - Friday  8:30a - 5p   [facility]  [address]  [phone]  Wednesday AM each week   [facility]  [place]  [address] Adult Internal Medicine   [facility])  [phone]  [facility]  [phone]    [facility]  [phone]    Podiatry  [phone] Monday, Tuesday, Thursday, Friday  8a - 4p  Wednesday 1p - 4p  Monday, Tuesday, Thursday, Friday  10a - 4p  Wednesday 1p - 4p  Monday, Tuesday, Thursday, Friday  8:30a - 4:15p  Wednesday 12:30p - 4:15p  Monday, Wednesday, Friday  1p - [place]. [address]  [phone] Pediatric Primary Care  Adult Primary Care  OB/Prenatal Monday - Wednesday, Friday  Monday - Friday 8a - 12p  Thursday 8a - 4:45p   [facility]  [address] [facility]  [phone]  [address]  [phone] Pregnancy  Pre & Post Adoption Counseling  Pregnancy Support  Prenatal / nutrition Care  [employer] Incentive Program Mon & Thurs (10a - 2p)  Tues (10a - 430p)  Kara Freeman (9a - 1p)   [place]  [place]   [phone]  Adult Internal Medicine   [phone]  Pediatrics  [phone]  Neuro / Headache  [phone] Monday - Friday  [place]  [address]  [phone] Family Practice Monday - Friday  9a - 5p   [facility]  [address]  [phone] Family Practice Monday, Tuesday, Thursday, Friday  9a - 5p  (closed 12p - 1p)  Wednesday 1p - 5p   [place]  [phone] Burn/Plastric, ENT, GI, Orthopedics, Surgical / Trauma, Urology, Vascular Call for an appointment   [facility]  [address]  [phone] Adult Medicine, [facility], Dental  Patient must be certified homeless  Under age 70 not accepted Days and hours vary (Doors open at 8:30a - day of week varies)  Call for an appointment     [facility]  [place]  [phone]  OB   [phone] Monday, Tuesday, Wednesday, Friday 9a - 5p  Thursday 9a - 6p  Wednesday (OB only)   [facility]  [address]  [phone]    [address]  [phone]   OB/Prenatal      OB/Prenatal Monday - Thursday pa - 6p  Friday 10a - 3p  Saturday 1st & 3rd 10a - 2p  Wednesday 6p - 8p (HIV Testing)  Monday - Friday  10a - 3p   [facility]  [phone] Free nurse visits  [place] programs  Counseling  Pregnancy Class Call   [facility]  [place]  [phone] Various [place] [address]  [phone] OB/Prenatal    Family Practice Tuesday 8a - 4:45p    Monday - Wednesday, Friday  [place]  [address]  [phone] Family Practice Monday - Friday  8a - 4:30p   [facility]  [address] [place] [address]  [phone] [place] [address]  [phone]    Monday - Friday  8a - 4:30p    Monday - Friday 8a - 4:30p  Thursday 8a - 8p

## 2013-10-27 NOTE — ED Provider Notes (Signed)
[place]  eMERGENCY dEPARTMENT eNCOUnter      Pt Name: Kara Freeman  MRN: [id number]  [place] [birth year]  Date of evaluation: 10/27/2013  Provider: Waneta Fitting Dion Saucier NP, [place]       Chief Complaint   Patient presents with   ??? Abcess     lt posterior thigh past 4 days         HISTORY OF PRESENT ILLNESS  (Location/Symptom, Timing/Onset, Context/Setting, Quality, Duration, Modifying Factors, Severity.)   Kara Freeman is a 22 y.o. female who presents to the emergency department today by private automobile with complaints of an abscess.  Patient states that she's had this red raised area to the left inner thigh for the past 4 days.  She states this started off as a small pimple is progressively gotten larger and more painful.  She rates the pain a 9 on a 0-10 scale.  She denies any associated fevers or chills.  She states she has no history of abscesses in the past.      Nursing Notes were reviewed.    ALLERGIES     Review of patient's allergies indicates no known allergies.    CURRENT MEDICATIONS       Discharge Medication List as of 10/27/2013  8:00 PM      CONTINUE these medications which have NOT CHANGED    Details   doxycycline (VIBRA-TABS) 100 MG tablet Take 1 tablet by mouth 2 times daily., Disp-14 tablet, R-0      !! fluconazole (DIFLUCAN) 150 MG tablet Take 1 tablet by mouth daily., Disp-4 tablet, R-0      ValACYclovir HCl (VALTREX PO) Take 400 mg by mouth 2 times daily as needed.      silver sulfADIAZINE (SILVADENE) 1 % cream Apply topically twice a day, Disp-50 g, R-1, Normal      ibuprofen (ADVIL;MOTRIN) 800 MG tablet Take 1 tablet by mouth every 8 hours as needed for Pain., Disp-30 tablet, R-0      oxyCODONE-acetaminophen (PERCOCET) 5-325 MG per tablet Take 1-2 tablets by mouth every 6 hours as needed for Pain., Disp-20 tablet, R-0      !! fluconazole (DIFLUCAN) 150 MG tablet Take 1 tablet by mouth every 72 hours., Disp-3 tablet, R-0       !! - Potential duplicate  medications found. Please discuss with provider.          PAST MEDICAL HISTORY         Diagnosis Date   ??? Herpes        SURGICAL HISTORY     No past surgical history on file.      FAMILY HISTORY     No family history on file.  No family status information on file.        SOCIAL HISTORY      reports that she has never smoked. She does not have any smokeless tobacco history on file. She reports that she does not drink alcohol or use illicit drugs.    REVIEW OF SYSTEMS    (2-9 systems for level 4, 10 or more for level 5)     Review of Systems   Constitutional: Negative for fever, chills and unexpected weight change.   HENT: Negative for congestion, rhinorrhea, sinus pressure and sore throat.    Respiratory: Negative for cough, shortness of breath and wheezing.    Cardiovascular: Negative for chest pain and palpitations.   Gastrointestinal: Negative for nausea, vomiting, abdominal pain, diarrhea and  constipation.   Genitourinary: Negative for dysuria and hematuria.   Musculoskeletal: Negative for myalgias and arthralgias.   Skin: Positive for wound. Negative for color change and rash.   Neurological: Negative for dizziness, weakness and headaches.   Hematological: Negative for adenopathy.        Except as noted above the remainder of the review of systems was reviewed and negative.     PHYSICAL EXAM    (up to 7 for level 4, 8 or more for level 5)   ED Triage Vitals   BP Temp Temp Source Pulse Resp SpO2 Height Weight   10/27/13 1914 10/27/13 1914 10/27/13 1914 10/27/13 1914 10/27/13 1914 10/27/13 1914 10/27/13 1914 10/27/13 1914   126/77 mmHg 98.2 ??F (36.8 ??C) Oral 97 18 99 % 5' 6"$  (1.676 m) 214 lb 8.1 oz (97.299 kg)       Physical Exam   Constitutional: She is oriented to person, place, and time. She appears well-developed and well-nourished.   HENT:   Head: Normocephalic and atraumatic.   Mouth/Throat: Oropharynx is clear and moist.   Eyes: Conjunctivae are normal. Pupils are equal, round, and reactive to light.    Neck: Normal range of motion. Neck supple.   Cardiovascular: Normal rate and regular rhythm.    Pulmonary/Chest: Effort normal and breath sounds normal. No stridor. No respiratory distress.   Abdominal: Soft. Bowel sounds are normal.   Musculoskeletal: Normal range of motion.   Lymphadenopathy:     She has no cervical adenopathy.   Neurological: She is alert and oriented to person, place, and time.   Skin: Skin is warm and dry. No rash noted.        Psychiatric: She has a normal mood and affect.   Vitals reviewed.          LABS:  Labs Reviewed - No data to display    All other labs were within normal range or not returned as of this dictation.    EMERGENCY DEPARTMENT COURSE and DIFFERENTIAL DIAGNOSIS/MDM:   Vitals:    Filed Vitals:    10/27/13 1914   BP: 126/77   Pulse: 97   Temp: 98.2 ??F (36.8 ??C)   TempSrc: Oral   Resp: 18   Height: 5' 6"$  (1.676 m)   Weight: 214 lb 8.1 oz (97.299 kg)   SpO2: 99%       PROCEDURES:  Incision and Drainage Procedure Note    Indication: abscess      Procedure: The patient was positioned appropriately and the skin over the incision site was prepped in sterile fashion and cleansed with Betadine. Local anesthesia was local infiltration with 1% lidocaine without epinephrine.  An incision was then made over the apex of the lesion and approximately 3 mL of purulent material was expressed. }. The drainage cavity was then packed with quarter inch iodoform Nu Gauze .    The patient tolerated the procedure well    Complications: {None          FINAL IMPRESSION      1. Abscess          DISPOSITION/PLAN   DISPOSITION Decision to Discharge    PATIENT REFERRED TO:   see list    Call in 2 days  RETURN TO ed if symptoms worsen      DISCHARGE MEDICATIONS:     Discharge Medication List as of 10/27/2013  8:00 PM      START taking these medications    Details   sulfamethoxazole-trimethoprim (  BACTRIM DS) 800-160 MG per tablet Take 1 tablet by mouth 2 times daily for 10 days., Disp-20 tablet, R-0       cephALEXin (KEFLEX) 500 MG capsule Take 1 capsule by mouth 3 times daily., Disp-30 capsule, R-0      HYDROcodone-acetaminophen (NORCO) 5-325 MG per tablet Take 1 tablet by mouth every 6 hours as needed for Pain for up to 7 days., Disp-20 tablet, R-0                 (Please note that portions of this note were completed with a voice recognition program.  Efforts were made to edit the dictations but occasionally words are mis-transcribed.)    [place] NP, CNP  Certified Nurse Practitioner          [place], [place]  10/27/13 2330

## 2013-10-28 ENCOUNTER — Inpatient Hospital Stay
Admit: 2013-10-28 | Discharge: 2013-10-28 | Disposition: A | Discharge status: Home / Self Care | Attending: Emergency Medicine

## 2013-10-28 MED ORDER — HYDROCODONE-ACETAMINOPHEN 5-325 MG PO TABS
5-325 MG | ORAL_TABLET | Freq: Four times a day (QID) | ORAL | Status: AC | PRN
Start: 2013-10-28 — End: 2013-11-03

## 2013-10-28 MED ORDER — CEPHALEXIN 500 MG PO CAPS
500 MG | ORAL_CAPSULE | Freq: Three times a day (TID) | ORAL | Status: DC
Start: 2013-10-28 — End: 2014-02-10

## 2013-10-28 MED ORDER — SULFAMETHOXAZOLE-TRIMETHOPRIM 800-160 MG PO TABS
800-160 MG | ORAL_TABLET | Freq: Two times a day (BID) | ORAL | Status: AC
Start: 2013-10-28 — End: 2013-11-06

## 2013-10-28 MED ORDER — FLUCONAZOLE 150 MG PO TABS
150 MG | ORAL_TABLET | Freq: Once | ORAL | Status: AC
Start: 2013-10-28 — End: 2013-10-27

## 2013-10-28 MED ADMIN — lidocaine 1 % injection 5 mL: INTRADERMAL | @ 01:00:00 | NDC 63323020110

## 2014-02-10 ENCOUNTER — Inpatient Hospital Stay
Admit: 2014-02-10 | Discharge: 2014-02-10 | Disposition: A | Discharge status: Home / Self Care | Attending: Emergency Medicine

## 2014-02-10 MED ORDER — BENZONATATE 100 MG PO CAPS
100 MG | ORAL_CAPSULE | Freq: Three times a day (TID) | ORAL | Status: AC | PRN
Start: 2014-02-10 — End: 2014-02-17

## 2014-02-10 MED ORDER — IBUPROFEN 600 MG PO TABS
600 MG | ORAL_TABLET | Freq: Three times a day (TID) | ORAL | Status: DC | PRN
Start: 2014-02-10 — End: 2016-06-23

## 2014-02-10 MED ORDER — CEPHALEXIN 500 MG PO CAPS
500 MG | ORAL_CAPSULE | Freq: Three times a day (TID) | ORAL | Status: DC
Start: 2014-02-10 — End: 2014-07-08

## 2014-02-10 MED ORDER — SULFAMETHOXAZOLE-TRIMETHOPRIM 800-160 MG PO TABS
800-160 MG | ORAL_TABLET | Freq: Two times a day (BID) | ORAL | Status: DC
Start: 2014-02-10 — End: 2014-07-08

## 2014-02-10 NOTE — ED Provider Notes (Signed)
[place]  eMERGENCY dEPARTMENT eNCOUnter      Pt Name: Kara Freeman  MRN: [id number]  [place] 20-Feb-1991  Date of evaluation: 02/10/2014  Provider: Verne Grain, MD    CHIEF COMPLAINT       Chief Complaint   Patient presents with   ??? Abscess         HISTORY OF PRESENT ILLNESS  (Location/Symptom, Timing/Onset, Context/Setting, Quality, Duration, Modifying Factors, Severity.)   Kara Freeman is a 23 y.o. female who presents to the emergency department with a one-week history of sore throat, runny stuffy nose, cough and congestion with sputum production.  She denies chills or fever.  She denies any possibility of pregnancy.  She also complains of a boil on the left thigh for the last 3 days.       Nursing Notes were reviewed.    ALLERGIES     Review of patient's allergies indicates no known allergies.    CURRENT MEDICATIONS       Previous Medications    VALACYCLOVIR HCL (VALTREX PO)    Take 400 mg by mouth 2 times daily as needed.       PAST MEDICAL HISTORY         Diagnosis Date   ??? Herpes        SURGICAL HISTORY           Procedure Laterality Date   ??? Cesarean section           FAMILY HISTORY     History reviewed. No pertinent family history.  No family status information on file.        SOCIAL HISTORY      reports that she has never smoked. She does not have any smokeless tobacco history on file. She reports that she does not drink alcohol or use illicit drugs.    REVIEW OF SYSTEMS    (2-9 systems for level 4, 10 or more for level 5)      Except as noted above the remainder of the review of systems was reviewed and negative.     PHYSICAL EXAM    (up to 7 for level 4, 8 or more for level 5)   ED Triage Vitals   BP Temp Temp Source Pulse Resp SpO2 Height Weight   02/10/14 1011 02/10/14 1011 02/10/14 1011 02/10/14 1011 02/10/14 1011 02/10/14 1011 02/10/14 1011 02/10/14 1011   130/73 mmHg 98 ??F (36.7 ??C) Oral 92 16 99 % 5' 6"$  (1.676 m) 200 lb (90.719 kg)       Physical Exam   Constitutional: She appears  well-developed and well-nourished. No distress.   HENT:   Head: Normocephalic.   Right Ear: External ear normal.   Left Ear: External ear normal.   Mouth/Throat: Oropharynx is clear and moist.   Eyes: EOM are normal. Pupils are equal, round, and reactive to light. No scleral icterus.   Neck: Normal range of motion. Neck supple.   Cardiovascular: Normal rate, regular rhythm and normal heart sounds.    Pulmonary/Chest: Effort normal and breath sounds normal. No respiratory distress.   Musculoskeletal:   Follicular abscess with underlying induration noted on anterior aspect of left proximal thigh, approx 4 cm in diameter with surrounding cellulitis.  There is a smaller central area of fluctuance which is draining purulent material through a central opening.  I was able to express a small amount of pus through this .   Lymphadenopathy:     She has no cervical adenopathy.  Skin: Skin is warm and dry.   Nursing note and vitals reviewed.      EMERGENCY DEPARTMENT COURSE and DIFFERENTIAL DIAGNOSIS/MDM:   Vitals:    Filed Vitals:    02/10/14 1011   BP: 130/73   Pulse: 92   Temp: 98 ??F (36.7 ??C)   TempSrc: Oral   Resp: 16   Height: 5' 6"$  (1.676 m)   Weight: 200 lb (90.719 kg)   SpO2: 99%       Orders Placed This Encounter   Medications   ??? sulfamethoxazole-trimethoprim (BACTRIM DS) 800-160 MG per tablet     Sig: Take 1 tablet by mouth 2 times daily.     Dispense:  20 tablet     Refill:  0   ??? cephALEXin (KEFLEX) 500 MG capsule     Sig: Take 1 capsule by mouth 3 times daily.     Dispense:  30 capsule     Refill:  0   ??? ibuprofen (ADVIL;MOTRIN) 600 MG tablet     Sig: Take 1 tablet by mouth 3 times daily as needed for Pain (Take with food.).     Dispense:  15 tablet     Refill:  0   ??? benzonatate (TESSALON PERLES) 100 MG capsule     Sig: Take 1 capsule by mouth 3 times daily as needed for Cough for up to 7 days.     Dispense:  20 capsule     Refill:  0       Medical Decision Making:     Patient is advised warm compresses to  abscessed area and recheck in 3 days.  Antibiotics and antitussive prescribed.    FINAL IMPRESSION      1. Abscess of left thigh    2. Upper respiratory infection          DISPOSITION/PLAN   DISPOSITION Decision to Discharge    PATIENT REFERRED TO:   Family physician    In 3 days      [facility]  [address]  [place] [address]  [phone]  In 3 days  if unable to see family physician      DISCHARGE MEDICATIONS:     New Prescriptions    BENZONATATE (TESSALON PERLES) 100 MG CAPSULE    Take 1 capsule by mouth 3 times daily as needed for Cough for up to 7 days.    CEPHALEXIN (KEFLEX) 500 MG CAPSULE    Take 1 capsule by mouth 3 times daily.    IBUPROFEN (ADVIL;MOTRIN) 600 MG TABLET    Take 1 tablet by mouth 3 times daily as needed for Pain (Take with food.).    SULFAMETHOXAZOLE-TRIMETHOPRIM (BACTRIM DS) 800-160 MG PER TABLET    Take 1 tablet by mouth 2 times daily.       (Please note that portions of this note were completed with a voice recognition program.  Efforts were made to edit the dictations but occasionally words are mis-transcribed.)    Verne Grain, MD  Attending Emergency Physician          Verne Grain, MD  02/10/14 [phone]

## 2014-02-10 NOTE — ED Notes (Signed)
Neosporin w/ bandade applied to lt upper anterior thigh abcess site. Pt instructed to do warm moist compresses 3x daily followed by attempts to express further drainage from abcess site    Duane BostonSharon Shantai Tiedeman, LPN  [id number]/95/6204/15/15 [phone]

## 2014-02-10 NOTE — ED Notes (Signed)
Red swollen draining abscess left upper thigh area past several days. No fever. "had one before on the back of my leg" also c/o URI symptoms . resp even non labored.    Aliene AltesJacqueline A Layci Stenglein, RN  02/10/14 [phone]

## 2014-02-10 NOTE — Discharge Instructions (Signed)
Skin Abscess: After Your Visit  Your Care Instructions     A skin abscess is a bacterial infection that forms a pocket of pus. A boil is a kind of skin abscess. The doctor may have cut an opening in the abscess so that the pus can drain out. You may have gauze in the cut so that the abscess will stay open and keep draining. You may need antibiotics. You will need to follow up with your doctor to make sure the infection has gone away.  The doctor has checked you carefully, but problems can develop later. If you notice any problems or new symptoms, get medical treatment right away.  Follow-up care is a key part of your treatment and safety. Be sure to make and go to all appointments, and call your doctor if you are having problems. It's also a good idea to know your test results and keep a list of the medicines you take.  How can you care for yourself at home?   Apply warm and dry compresses, a heating pad set on low, or a hot water bottle 3 or 4 times a day for pain. Keep a cloth between the heat source and your skin.   If your doctor prescribed antibiotics, take them as directed. Do not stop taking them just because you feel better. You need to take the full course of antibiotics.   Take pain medicines exactly as directed.   If the doctor gave you a prescription medicine for pain, take it as prescribed.   If you are not taking a prescription pain medicine, ask your doctor if you can take an over-the-counter medicine.   Keep your bandage clean and dry. Change the bandage whenever it gets wet or dirty, or at least one time a day.   If the abscess was packed with gauze:   Keep follow-up appointments to have the gauze changed or removed. If the doctor instructed you to remove the gauze, gently pull out all of the gauze when your doctor tells you to.   After the gauze is removed, soak the area in warm water for 15 to 20 minutes 2 times a day, until the wound closes.  When should you call for help?  Call your  doctor now or seek immediate medical care if:   You have signs of worsening infection, such as:   Increased pain, swelling, warmth, or redness.   Red streaks leading from the infected skin.   Pus draining from the wound.   A fever.  Watch closely for changes in your health, and be sure to contact your doctor if:   You do not get better as expected.   Where can you learn more?   Go to [email or web address] and sign in to your MyChart account. Enter [phone] in the [place] box to learn more about "Skin Abscess: After Your Visit."    If you do not have an account, please click on the "Sign Up Now" link.      2006-2015 Healthwise, Inc[employer]. Care instructions adapted under license by [facility]. This care instruction is for use with your licensed healthcare professional. If you have questions about a medical condition or this instruction, always ask your healthcare professional. [place] any warranty or liability for your use of this information.  Content Version: 10.4.390249; Current as of: January 07, 2013                Upper Respiratory Infection (Cold): After Your Visit  Your Care Instructions     An upper respiratory infection, or URI, is an infection of the nose, sinuses, or throat. URIs are spread by coughs, sneezes, and direct contact. The common cold is the most frequent kind of URI. The flu and sinus infections are other kinds of URIs.  Almost all URIs are caused by viruses. Antibiotics won't cure them. But you can treat most infections with home care. This may include drinking lots of fluids and taking over-the-counter pain medicine. You will probably feel better in 4 to 10 days.  The doctor has checked you carefully, but problems can develop later. If you notice any problems or new symptoms, get medical treatment right away.  Follow-up care is a key part of your treatment and safety. Be sure to make and go to all appointments, and call your  doctor if you are having problems. It's also a good idea to know your test results and keep a list of the medicines you take.  How can you care for yourself at home?   To prevent dehydration, drink plenty of fluids, enough so that your urine is light yellow or clear like water. Choose water and other caffeine-free clear liquids until you feel better. If you have kidney, heart, or liver disease and have to limit fluids, talk with your doctor before you increase the amount of fluids you drink.   Take an over-the-counter pain medicine, such as acetaminophen (Tylenol), ibuprofen (Advil, Motrin), or naproxen (Aleve). Read and follow all instructions on the label.   If your doctor prescribed antibiotics, take them as directed. Do not stop taking them just because you feel better. You need to take the full course of antibiotics.   Before you use cough and cold medicines, check the label. These medicines may not be safe for young children or for people with certain health problems.   Be careful when taking over-the-counter cold or flu medicines and Tylenol at the same time. Many of these medicines have acetaminophen, which is Tylenol. Read the labels to make sure that you are not taking more than the recommended dose. Too much acetaminophen (Tylenol) can be harmful.   Get plenty of rest.   Do not smoke or allow others to smoke around you. If you need help quitting, talk to your doctor about stop-smoking programs and medicines. These can increase your chances of quitting for good.  When should you call for help?  Call 911 anytime you think you may need emergency care. For example, call if:   You have severe trouble breathing.  Call your doctor now or seek immediate medical care if:   You seem to be getting much sicker.   You have new or worse trouble breathing.   You have a new or higher fever.   You have a new rash.  Watch closely for changes in your health, and be sure to contact your doctor if:   You have a  new symptom, such as a sore throat, an earache, or sinus pain.   You cough more deeply or more often, especially if you notice more mucus or a change in the color of your mucus.   You do not get better as expected.   Where can you learn more?   Go to [email or web address] and sign in to your MyChart account. Enter [phone] in the [place] box to learn more about "Upper Respiratory Infection (Cold): After Your Visit."    If you do not have an account, please click  on the "Sign Up Now" link.      2006-2015 Healthwise, Incorporated. Care instructions adapted under license by [facility]. This care instruction is for use with your licensed healthcare professional. If you have questions about a medical condition or this instruction, always ask your healthcare professional. [place] any warranty or liability for your use of this information.  Content Version: 10.4.390249; Current as of: July 07, 2013

## 2014-07-08 ENCOUNTER — Inpatient Hospital Stay
Admit: 2014-07-08 | Discharge: 2014-07-09 | Disposition: A | Discharge status: Home / Self Care | Attending: Emergency Medicine

## 2014-07-08 LAB — STREP SCREEN GROUP A THROAT
Direct Exam: 407.3
Direct Exam: NEGATIVE

## 2014-07-08 MED ORDER — AMOXICILLIN 500 MG PO CAPS
500 MG | ORAL_CAPSULE | Freq: Three times a day (TID) | ORAL | Status: AC
Start: 2014-07-08 — End: 2014-07-18

## 2014-07-08 NOTE — ED Provider Notes (Signed)
Attending Supervisory Note/Shared Visit   I have personally performed a face to face diagnostic evaluation on this patient. I have reviewed the mid-level???s findings and agree.  With treatment and management      (Please note that portions of this note were completed with a voice recognition program.  Efforts were made to edit the dictations but occasionally words are mis-transcribed.)    Katheran James, MD  Attending Emergency Physician      Katheran James, MD  07/08/14 [phone]

## 2014-07-08 NOTE — ED Provider Notes (Signed)
eMERGENCY dEPARTMENT eNCOUnter        [place]    Chief Complaint   Patient presents with   ??? Pharyngitis     cough/hoarse past 3 days       HPI    Kara Freeman is a 23 y.o. female who presents With complaints of sore throat that started 3 days ago.  She is unsure fevers but admits to hot and cold chills.  No nasal congestion or drainage.  She does admit to a dry cough.  No chest pain or shortness of breath. Throat pain is rated a 10 and described as sharp and burning.  She also complains of hoarse voice.  No aggravating or alleviating factors.  She is able to tolerate food and fluids. She is pregnant and called her ObGyn who advised her to come to the emergency room for evaluation.    REVIEW OF SYSTEMS    See HPI for further details. Review of systems otherwise negative.     PAST MEDICAL HISTORY    Past Medical History   Diagnosis Date   ??? Herpes        SURGICAL HISTORY    Past Surgical History   Procedure Laterality Date   ??? Cesarean section         CURRENT MEDICATIONS    Current Outpatient Rx   Name  Route  Sig  Dispense  Refill   ??? amoxicillin (AMOXIL) 500 MG capsule    Oral    Take 1 capsule by mouth 3 times daily for 10 days    30 capsule    0     ??? ibuprofen (ADVIL;MOTRIN) 600 MG tablet    Oral    Take 1 tablet by mouth 3 times daily as needed for Pain (Take with food.).    15 tablet    0     ??? ValACYclovir HCl (VALTREX PO)    Oral    Take 400 mg by mouth 2 times daily as needed During outbreaks                 ALLERGIES    No Known Allergies    FAMILY HISTORY    No family history on file.    SOCIAL HISTORY    History     Social History   ??? Marital Status: Single     Spouse Name: N/A     Number of Children: N/A   ??? Years of Education: N/A     Social History Main Topics   ??? Smoking status: Never Smoker    ??? Smokeless tobacco: None   ??? Alcohol Use: No   ??? Drug Use: No   ??? Sexual Activity: None     Other Topics Concern   ??? None     Social History Narrative       PHYSICAL EXAM    VITAL SIGNS: BP 131/62  mmHg   Pulse 94   Temp(Src) 98.3 ??F (36.8 ??C) (Oral)   Resp 16   Ht 5' 6"$  (1.676 m)   Wt 190 lb 11.2 oz (86.5 kg)   BMI 30.79 kg/m2   SpO2 99%   LMP 01/27/2014  Constitutional:  Well developed, well nourished, no acute distress, non-toxic appearance   Eyes: PERRL, conjunctiva normal   HENT:  Atraumatic.  Nose and ears are symmetric.  Posterior oropharynx is clear and well hydrated.  Neck is supple without lymphadenopathy   Respiratory:  Clear throughout   Cardiovascular:  Normal rate, normal  rhythm  Musculoskeletal:  No edema   Integument:  Well hydrated    ED COURSE & MEDICAL DECISION MAKING    Pertinent Labs & Imaging studies reviewed. (See chart for details)  This is a pleasant 23 year old female who presents with sore throat for 3 days.  She is afebrile and nontoxic in appearance. Strep screen is negative.  She was discharged home with with a prescription for amoxicillin.  She was also given a work note.  She will follow up with her ObGyn.    FINAL IMPRESSION    1.  pharyngitis  2.        Sharyon Medicus, NP  07/08/14 2006

## 2014-07-08 NOTE — Discharge Instructions (Signed)
Sore Throat: Care Instructions  Your Care Instructions     Infection by bacteria or a virus causes most sore throats. Cigarette smoke, dry air, air pollution, allergies, and yelling can also cause a sore throat. Sore throats can be painful and annoying. Fortunately, most sore throats go away on their own. If you have a bacterial infection, your doctor may prescribe antibiotics.  Follow-up care is a key part of your treatment and safety. Be sure to make and go to all appointments, and call your doctor if you are having problems. It's also a good idea to know your test results and keep a list of the medicines you take.  How can you care for yourself at home?   If your doctor prescribed antibiotics, take them as directed. Do not stop taking them just because you feel better. You need to take the full course of antibiotics.   Gargle with warm salt water once an hour to help reduce swelling and relieve discomfort. Use 1 teaspoon of salt mixed in 1 cup of warm water.   Take an over-the-counter pain medicine, such as acetaminophen (Tylenol), ibuprofen (Advil, Motrin), or naproxen (Aleve). Read and follow all instructions on the label.   Be careful when taking over-the-counter cold or flu medicines and Tylenol at the same time. Many of these medicines have acetaminophen, which is Tylenol. Read the labels to make sure that you are not taking more than the recommended dose. Too much acetaminophen (Tylenol) can be harmful.   Drink plenty of fluids. Fluids may help soothe an irritated throat. Hot fluids, such as tea or soup, may help decrease throat pain.   Use over-the-counter throat lozenges to soothe pain. Regular cough drops or hard candy may also help. These should not be given to young children because of the risk of choking.   Do not smoke or allow others to smoke around you. If you need help quitting, talk to your doctor about stop-smoking programs and medicines. These can increase your chances of quitting for  good.   Use a vaporizer or humidifier to add moisture to your bedroom. Follow the directions for cleaning the machine.  When should you call for help?  Call your doctor now or seek immediate medical care if:   You have new or worse trouble swallowing.   Your sore throat gets much worse on one side.  Watch closely for changes in your health, and be sure to contact your doctor if you do not get better as expected.   Where can you learn more?   Go to [email or web address] and sign in to your MyChart account. Enter U420 in the Search Health Information box to learn more about "Sore Throat: Care Instructions."    If you do not have an account, please click on the "Sign Up Now" link.      2006-2015 [employer]. Care instructions adapted under license by [facility]. This care instruction is for use with your licensed healthcare professional. If you have questions about a medical condition or this instruction, always ask your healthcare professional. Health[employer], Incorporated disclaims any warranty or liability for your use of this information.  Content Version: 10.6.465758; Current as of: September 11, 2013

## 2014-07-11 LAB — STREP A CULTURE, THROAT
Culture: NEGATIVE
Specimen Description: 43623

## 2015-01-13 ENCOUNTER — Encounter: Admit: 2015-01-14 | Discharge: 2015-01-19 | Primary: Student in an Organized Health Care Education/Training Program

## 2015-01-13 DIAGNOSIS — J40 Bronchitis, not specified as acute or chronic: Secondary | ICD-10-CM

## 2015-01-13 NOTE — ED Notes (Signed)
Pt presents to ED with c/o chest pain for the past 3 days. Patient denies any hx. Asthma or other cardiac or resp issues. Patient states she had c-section 8 days ago. Pt denies taking anything for relief prior to arrival.      Kara KenningJeania L Yacine Droz, RN  01/13/15 [phone]

## 2015-01-14 ENCOUNTER — Inpatient Hospital Stay: Admit: 2015-01-14 | Discharge: 2015-01-14 | Disposition: A | Discharge status: Home / Self Care

## 2015-01-14 LAB — STREP A DNA PROBE, AMPLIFICATION
Direct Exam: NEGATIVE
Specimen Description: 43623

## 2015-01-14 LAB — MICROSCOPIC URINALYSIS
Epithelial Cells UA: 2 /HPF
RBC, UA: 5 /HPF (ref 0–2)
WBC, UA: 2 /HPF (ref 0–5)

## 2015-01-14 LAB — CBC WITH AUTO DIFFERENTIAL
Absolute Eos #: 0.1 10*3/uL (ref 0.0–0.4)
Absolute Lymph #: 2.4 10*3/uL (ref 1.0–4.8)
Absolute Mono #: 0.6 10*3/uL (ref 0.2–0.8)
Basophils Absolute: 0.1 10*3/uL (ref 0.0–0.2)
Basophils: 1 % (ref 0–2)
Eosinophils %: 1 % (ref 1–4)
Hematocrit: 36.8 % (ref 36–46)
Hemoglobin: 12.1 g/dL (ref 12.0–16.0)
Lymphocytes: 24 % (ref 24–44)
MCH: 28.3 pg (ref 26–34)
MCHC: 32.9 g/dL (ref 31–37)
MCV: 85.9 fL (ref 80–100)
Monocytes: 6 % (ref 1–7)
Platelets: 460 10*3/uL — ABNORMAL HIGH (ref 130–400)
RBC: 4.28 m/uL (ref 4.0–5.2)
RDW: 13.2 % (ref 11.5–14.5)
Seg Neutrophils: 68 % — ABNORMAL HIGH (ref 36–66)
Segs Absolute: 6.9 10*3/uL (ref 1.8–7.7)
WBC: 10.1 10*3/uL (ref 3.5–11.0)

## 2015-01-14 LAB — D-DIMER, QUANTITATIVE: D-Dimer, Quant: 2.28 mg/L FEU

## 2015-01-14 LAB — CK: Total CK: 26 U/L (ref 26–192)

## 2015-01-14 LAB — BASIC METABOLIC PANEL
Anion Gap: 15 mmol/L (ref 9–17)
BUN: 6 mg/dL (ref 6–20)
Bun/Cre Ratio: 10 (ref 9–20)
CO2: 21 mmol/L (ref 20–31)
Calcium: 8.7 mg/dL (ref 8.6–10.4)
Chloride: 103 mmol/L (ref 98–107)
Creatinine: 0.63 mg/dL (ref 0.50–0.90)
GFR African American: >60 mL/min (ref >60)
GFR Non-African American: >60 mL/min (ref >60)
Glucose: 79 mg/dL (ref 70–99)
Potassium: 4 mmol/L (ref 3.7–5.3)
Sodium: 139 mmol/L (ref 135–144)

## 2015-01-14 LAB — RAPID INFLUENZA A/B ANTIGENS
Direct Exam: 407.3
Direct Exam: NEGATIVE

## 2015-01-14 LAB — STREP SCREEN GROUP A THROAT: Direct Exam: 407.3

## 2015-01-14 LAB — APTT: PTT: 27.8 s (ref 23–31)

## 2015-01-14 LAB — PROTIME-INR
INR: 0.9
Protime: 9.7 s (ref 9.7–11.6)

## 2015-01-14 LAB — URINALYSIS
Bilirubin Urine: NEGATIVE
Glucose, Ur: NEGATIVE
Ketones, Urine: NEGATIVE
Nitrite, Urine: NEGATIVE
Protein, UA: NEGATIVE
Specific Gravity, UA: 1.01 (ref 1.005–1.030)
Urobilinogen, Urine: NORMAL
pH, UA: 7 (ref 5.0–8.0)

## 2015-01-14 LAB — TROPONIN: Troponin T: <0.03 ng/mL (ref <0.03)

## 2015-01-14 LAB — MYOGLOBIN, SERUM: Myoglobin: <21 ng/mL — ABNORMAL LOW (ref 25–58)

## 2015-01-14 LAB — CK-MB: CK-MB: <1 ng/mL (ref <5.4)

## 2015-01-14 LAB — CALCIUM, IONIZED: Calcium, Ionized: 5.07 mg/dL (ref 4.50–5.30)

## 2015-01-14 MED ORDER — IPRATROPIUM-ALBUTEROL 0.5-2.5 (3) MG/3ML IN SOLN
RESPIRATORY_TRACT | Status: DC | PRN
Start: 2015-01-14 — End: 2015-01-14

## 2015-01-14 MED ORDER — ALBUTEROL SULFATE (5 MG/ML) 0.5% IN NEBU
RESPIRATORY_TRACT | Status: DC | PRN
Start: 2015-01-14 — End: 2015-01-14

## 2015-01-14 MED ORDER — ALBUTEROL SULFATE HFA 108 (90 BASE) MCG/ACT IN AERS
108 (90 Base) MCG/ACT | RESPIRATORY_TRACT | Status: DC | PRN
Start: 2015-01-14 — End: 2015-01-14

## 2015-01-14 MED ORDER — ALBUTEROL SULFATE HFA 108 (90 BASE) MCG/ACT IN AERS
108 (90 Base) MCG/ACT | RESPIRATORY_TRACT | Status: DC | PRN
Start: 2015-01-14 — End: 2017-12-24

## 2015-01-14 MED ORDER — ALBUTEROL SULFATE (2.5 MG/3ML) 0.083% IN NEBU
RESPIRATORY_TRACT | Status: DC | PRN
Start: 2015-01-14 — End: 2015-01-14

## 2015-01-14 MED ORDER — AZITHROMYCIN 250 MG PO TABS
250 MG | PACK | ORAL | Status: AC
Start: 2015-01-14 — End: 2015-01-24

## 2015-01-14 MED ORDER — IPRATROPIUM BROMIDE HFA 17 MCG/ACT IN AERS
17 MCG/ACT | RESPIRATORY_TRACT | Status: DC | PRN
Start: 2015-01-14 — End: 2015-01-14

## 2015-01-14 MED ORDER — IPRATROPIUM BROMIDE 0.02 % IN SOLN
0.02 % | RESPIRATORY_TRACT | Status: DC | PRN
Start: 2015-01-14 — End: 2015-01-14

## 2015-01-14 MED ORDER — BENZONATATE 100 MG PO CAPS
100 MG | ORAL_CAPSULE | Freq: Three times a day (TID) | ORAL | Status: AC | PRN
Start: 2015-01-14 — End: 2015-01-21

## 2015-01-14 NOTE — Other (Addendum)
Patient Acct Nbr:  [id number]  Primary AUTH/CERT:    Primary Insurance Company Name:   SELF PAY-PT RESP  Primary Insurance Plan Name:    Primary Insurance Group Number:    Primary Insurance Plan Type: [employer]  Primary Insurance Policy Number:  [id number]

## 2015-01-14 NOTE — Progress Notes (Signed)
Patient anxious to go home despite all labs being resulted. Patient states she "need to get her newborn baby home". Patient educated on importance of waiting for lab results however continues to threaten to leave. Dr. Benjiman CorePontasch made aware of patient plans and in to talk to patient.

## 2015-01-19 NOTE — ED Provider Notes (Signed)
[facility] EMERGENCY DEPARTMENT  eMERGENCY dEPARTMENT eNCOUnter      Pt Name: Kara Freeman  MRN: [id number]  Birthdate [birth year]  Date of evaluation: 01/13/2015  Provider: Bufford Spikes, MD    CHIEF COMPLAINT       Chief Complaint   Patient presents with   ??? Chest Pain     past 3 days         HISTORY OF PRESENT ILLNESS  (Location/Symptom, Timing/Onset, Context/Setting, Quality, Duration, Modifying Factors, Severity.)   Sharene Krikorian is a 24 y.o. female who presents to the emergency department presents with pleuritic chest pain for the past 3 days had intermittent productive cough.  States cough is been a yellowish type sputum       Nursing Notes were reviewed.    ALLERGIES     Bactrim    CURRENT MEDICATIONS       Discharge Medication List as of 01/14/2015 12:38 AM      CONTINUE these medications which have NOT CHANGED    Details   ibuprofen (ADVIL;MOTRIN) 600 MG tablet Take 1 tablet by mouth 3 times daily as needed for Pain (Take with food.)., Disp-15 tablet, R-0      ValACYclovir HCl (VALTREX PO) Take 400 mg by mouth 2 times daily as needed During outbreaks             PAST MEDICAL HISTORY         Diagnosis Date   ??? Herpes        SURGICAL HISTORY           Procedure Laterality Date   ??? Cesarean section           FAMILY HISTORY     History reviewed. No pertinent family history.  No family status information on file.        SOCIAL HISTORY      reports that she has never smoked. She does not have any smokeless tobacco history on file. She reports that she does not drink alcohol or use illicit drugs.    REVIEW OF SYSTEMS    (2-9 systems for level 4, 10 or more for level 5)     Review of Systems   Constitutional: Positive for diaphoresis and appetite change.   HENT: Positive for congestion and sinus pressure.    Eyes: Negative for photophobia and visual disturbance.   Respiratory: Positive for cough, shortness of breath and wheezing.    Cardiovascular: Negative for chest pain, palpitations and leg swelling.    Gastrointestinal: Positive for abdominal pain and abdominal distention.   Genitourinary: Positive for frequency and flank pain.   Musculoskeletal: Positive for myalgias and arthralgias.   Skin: Negative.    Neurological: Positive for dizziness and light-headedness.   Psychiatric/Behavioral: Negative for behavioral problems and agitation.         Except as noted above the remainder of the review of systems was reviewed and negative.     PHYSICAL EXAM    (up to 7 for level 4, 8 or more for level 5)   ED Triage Vitals   BP Temp Temp Source Pulse Resp SpO2 Height Weight   01/13/15 2246 01/13/15 2246 01/13/15 2246 01/13/15 2246 01/13/15 2246 01/13/15 2246 01/13/15 2246 01/13/15 2246   123/77 mmHg 98 ??F (36.7 ??C) Oral 119 16 96 %  (1.676 m) 182 lb (82.555 kg)       Physical Exam   Constitutional: She is oriented to person, place, and time. She appears well-developed.  HENT:   Head: Atraumatic.   Eyes: Conjunctivae and EOM are normal. Pupils are equal, round, and reactive to light.   Neck: Neck supple.   Cardiovascular: Normal rate, regular rhythm, normal heart sounds and intact distal pulses.    Pulmonary/Chest: Effort normal. She has wheezes.   Abdominal: Soft. Bowel sounds are normal.   Musculoskeletal: Normal range of motion. She exhibits no edema or tenderness.   Neurological: She is alert and oriented to person, place, and time.   Skin: Skin is warm and dry.   Psychiatric: She has a normal mood and affect. Her behavior is normal.   Nursing note and vitals reviewed.          DIAGNOSTIC RESULTS     EKG: All EKG's are interpreted by the Emergency Department Physician who either signs or Co-signs this chart in the absence of a cardiologist.    Normal sinus rhythm without acute process  RADIOLOGY:   Non-plain film images such as CT, Ultrasound and MRI are read by the radiologist. Plain radiographic images are visualized and preliminarily interpreted by the emergency physician with the below findings:      Xr Chest  Standard Two Vw    01/13/2015   Exam: Two-view chest  Date: January 13, 2015  INDICATION: Upper mid chest pain  COMPARISON: None  FINDINGS: The heart is not enlarged. No pulmonary venous congestion or edema. No focal lung consolidation or infiltrate. No pleural effusion or pneumothorax.  Osseous structures are grossly intact.      01/13/2015   IMPRESSION: NO ACUTE CARDIOPULMONARY PROCESS.  Final report electronically signed by Dianna LimboPia-Jolina H Dionisio, M.D. on 01/13/2015 11:08 PM    Interpretation per the Radiologist below, if available at the time of this note:    XR Chest Standard TWO VW   Final Result   IMPRESSION: NO ACUTE CARDIOPULMONARY PROCESS.      Final report electronically signed by Dianna LimboPia-Jolina H Dionisio, M.D. on 01/13/2015 11:08 PM            ED BEDSIDE ULTRASOUND:   Performed by ED Physician - none    LABS:  Labs Reviewed   MYOGLOBIN, SERUM - Abnormal; Notable for the following:     Myoglobin <21 (*)     All other components within normal limits   CBC WITH AUTO DIFFERENTIAL - Abnormal; Notable for the following:     Platelets 460 (*)     Seg Neutrophils 68 (*)     All other components within normal limits   URINALYSIS - Abnormal; Notable for the following:     Urine Hgb 3+ (*)     Leukocyte Esterase, Urine SMALL (*)     All other components within normal limits   MICROSCOPIC URINALYSIS - Abnormal; Notable for the following:     Bacteria, UA FEW (*)     Amorphous, UA 1+ (*)     All other components within normal limits   RAPID INFLUENZA A/B ANTIGENS   STREP SCREEN GROUP A THROAT   TROPONIN   BASIC METABOLIC PANEL   CALCIUM, IONIZED   CK-MB   CK   PROTIME-INR   APTT   D-DIMER, QUANTITATIVE   STREP A DNA PROBE, AMPLIFICATION       All other labs were within normal range or not returned as of this dictation.    EMERGENCY DEPARTMENT COURSE and DIFFERENTIAL DIAGNOSIS/MDM:   Vitals:    Filed Vitals:    01/13/15 2246   BP: 123/77   Pulse: 119  Temp: 98 ??F (36.7 ??C)   TempSrc: Oral   Resp: 16   Height:  (1.676 m)    Weight: 182 lb (82.555 kg)   SpO2: 96%     Patient's discharged in stable condition negative strep and influenza screens.  She is apprised follow-up care    CONSULTS:  None    PROCEDURES:  Not clinically indicated.      FINAL IMPRESSION      1. Bronchitis          DISPOSITION/PLAN   DISPOSITION Decision to Discharge    PATIENT REFERRED TO:   . None            DISCHARGE MEDICATIONS:     Discharge Medication List as of 01/14/2015 12:38 AM      START taking these medications    Details   azithromycin (ZITHROMAX) 250 MG tablet Take 2 tablets (500 mg) on Day 1, followed by 1 tablet (250 mg) once daily on Days 2 through 5., Disp-1 packet, R-0      benzonatate (TESSALON PERLES) 100 MG capsule Take 1 capsule by mouth 3 times daily as needed for Cough, Disp-30 capsule, R-0      albuterol sulfate HFA (PROVENTIL HFA) 108 (90 BASE) MCG/ACT inhaler Inhale 1-2 puffs into the lungs every 4 hours as needed for Wheezing or Shortness of Breath (Space out to every 6 hours as symptoms improve) Space out to every 6 hours as symptoms improve., Disp-1 Inhaler, R-0                 (Please note that portions of this note were completed with a voice recognition program.  Efforts were made to edit the dictations but occasionally words are mis-transcribed.)    Bufford Spikes, MD  Attending Emergency Physician          Bufford Spikes, MD  01/19/15 [phone]

## 2015-02-24 ENCOUNTER — Inpatient Hospital Stay: Admit: 2015-02-24 | Discharge: 2015-02-25 | Disposition: A | Discharge status: Home / Self Care

## 2015-02-24 DIAGNOSIS — Z202 Contact with and (suspected) exposure to infections with a predominantly sexual mode of transmission: Secondary | ICD-10-CM

## 2015-02-24 LAB — POCT URINE PREGNANCY: Preg Test, Ur: NEGATIVE

## 2015-02-24 MED ORDER — CEFTRIAXONE SODIUM 250 MG IJ SOLR
250 MG | Freq: Once | INTRAMUSCULAR | Status: AC
Start: 2015-02-24 — End: 2015-02-24
  Administered 2015-02-25: 250 mg via INTRAMUSCULAR

## 2015-02-24 MED ORDER — AZITHROMYCIN 250 MG PO TABS
250 MG | Freq: Once | ORAL | Status: AC
Start: 2015-02-24 — End: 2015-02-24
  Administered 2015-02-25: 1000 mg via ORAL

## 2015-02-24 MED FILL — CEFTRIAXONE SODIUM 250 MG IJ SOLR: 250 MG | INTRAMUSCULAR | Qty: 250

## 2015-02-24 MED FILL — AZITHROMYCIN 250 MG PO TABS: 250 MG | ORAL | Qty: 4

## 2015-02-24 NOTE — ED Notes (Signed)
[facility] and sent to lab.    Wilford SportsMelinda J Shuayb Schepers, LPN  16/07/9603/28/16 [id number]

## 2015-02-24 NOTE — Other (Addendum)
Patient Acct Nbr:  [id number]  Primary AUTH/CERT:    Primary Insurance Company Name:   [employer]  Primary Insurance Plan Name:  [id number]  Primary Insurance Group Number:  [id number]  Primary Insurance Plan Type: W  Primary Insurance Policy Number:  [id number]

## 2015-02-24 NOTE — ED Provider Notes (Signed)
[facility] EMERGENCY DEPARTMENT  eMERGENCY dEPARTMENT eNCOUnter      Pt Name: Kara Freeman  MRN: [id number]  Birthdate 05-02-1991  Date of evaluation: 02/24/2015  Provider: Raoul Pitch, PA-C    CHIEF COMPLAINT       Chief Complaint   Patient presents with   ??? Vaginal Discharge     onset 4-5 days ago   ??? Dysuria         HISTORY OF PRESENT ILLNESS  (Location/Symptom, Timing/Onset, Context/Setting, Quality, Duration, Modifying Factors, Severity.)   Kara Freeman is a 24 y.o. female who presents to the emergency department with aspirin discharge and burning over the last week.  She recently gave birth via C-section 7 weeks ago.  Her partner recently tested positive for gonorrhea and after having sex with him she has been having these symptoms.  He was recently given a double shot.  Has been having some vaginal bleeding described as spotting.  No fevers or chills.  No nausea or vomiting.  She is not breast-feeding.  Symptoms worsen urination.  No alleviating factors.    Nursing Notes were reviewed.    ALLERGIES     Bactrim    CURRENT MEDICATIONS       Previous Medications    ALBUTEROL SULFATE HFA (PROVENTIL HFA) 108 (90 BASE) MCG/ACT INHALER    Inhale 1-2 puffs into the lungs every 4 hours as needed for Wheezing or Shortness of Breath (Space out to every 6 hours as symptoms improve) Space out to every 6 hours as symptoms improve.    IBUPROFEN (ADVIL;MOTRIN) 600 MG TABLET    Take 1 tablet by mouth 3 times daily as needed for Pain (Take with food.).       PAST MEDICAL HISTORY         Diagnosis Date   ??? Herpes        SURGICAL HISTORY           Procedure Laterality Date   ??? Cesarean section           FAMILY HISTORY     History reviewed. No pertinent family history.  No family status information on file.        SOCIAL HISTORY      reports that she has never smoked. She does not have any smokeless tobacco history on file. She reports that she does not drink alcohol or use illicit drugs.    REVIEW OF SYSTEMS    (2-9  systems for level 4, 10 or more for level 5)   Review of Systems    Except as noted above the remainder of the review of systems was reviewed and negative.     PHYSICAL EXAM    (up to 7 for level 4, 8 or more for level 5)   ED Triage Vitals   BP Temp Temp Source Pulse Resp SpO2 Height Weight   02/24/15 1937 02/24/15 1937 02/24/15 1937 02/24/15 1937 02/24/15 1937 02/24/15 1937 02/24/15 1937 02/24/15 1937   128/66 mmHg 98.4 ??F (36.9 ??C) Oral 74 16 98 %  (1.676 m) 157 lb (71.215 kg)     Physical Exam   Constitutional: She is oriented to person, place, and time. She appears well-developed.   HENT:   Head: Normocephalic and atraumatic.   Neck: Normal range of motion. Neck supple.   Cardiovascular: Normal rate and regular rhythm.    Pulmonary/Chest: Effort normal and breath sounds normal.   Abdominal: Soft.   Genitourinary: Cervix exhibits discharge. Cervix exhibits no  motion tenderness.       Musculoskeletal: Normal range of motion.   Neurological: She is alert and oriented to person, place, and time.   Skin: Skin is warm. No rash noted.   Psychiatric: She has a normal mood and affect. Her behavior is normal.               DIAGNOSTIC RESULTS     EKG: All EKG's are interpreted by the Emergency Department Physician who either signs or Co-signs this chart in the absence of a cardiologist.        RADIOLOGY:   Non-plain film images such as CT, Ultrasound and MRI are read by the radiologist. Plain radiographic images are visualized and preliminarily interpreted by the emergency physician with the below findings:        Interpretation per the Radiologist below, if available at the time of this note:          ED BEDSIDE ULTRASOUND:   Performed by ED Physician - none    LABS:  Labs Reviewed   URINALYSIS - Abnormal; Notable for the following:     Turbidity UA SLIGHTLY CLOUDY (*)     Urine Hgb 3+ (*)     Leukocyte Esterase, Urine TRACE (*)     All other components within normal limits   MICROSCOPIC URINALYSIS - Abnormal;  Notable for the following:     Bacteria, UA MANY (*)     Amorphous, UA 2+ (*)     All other components within normal limits   POCT URINE PREGNANCY - Normal   VAGINITIS DNA PROBE   CHLAMYDIA TRACHOMATIS & NEISSERIA GONORRHOEAE (GC) BY AMPLIFIED DETECTION       All other labs were within normal range or not returned as of this dictation.    EMERGENCY DEPARTMENT COURSE and DIFFERENTIAL DIAGNOSIS/MDM:   Vitals:    Filed Vitals:    02/24/15 1937   BP: 128/66   Pulse: 74   Temp: 98.4 ??F (36.9 ??C)   TempSrc: Oral   Resp: 16   Height: 5\' 6"  (1.676 m)   Weight: 157 lb (71.215 kg)   SpO2: 98%    covered with rocephin and zithomax.  discharged home      CONSULTS:  None    PROCEDURES:  Procedures        FINAL IMPRESSION      1. STD exposure          DISPOSITION/PLAN   DISPOSITION     PATIENT REFERRED TO:   obgyn    In 3 days      [facility]  [address]  [place] [id number]  [phone]  In 3 days        DISCHARGE MEDICATIONS:     New Prescriptions    No medications on file           (Please note that portions of this note were completed with a voice recognition program.  Efforts were made to edit the dictations but occasionally words are mis-transcribed.)    Raoul PitchMatthew Christopher Brinnley Lacap, PA-C            Raoul PitchMatthew Christopher Jamahl Lemmons, PA-C  02/24/15 2104

## 2015-02-24 NOTE — ED Notes (Signed)
No allergic reaction noted at injection site of Rocephin.    Wilford SportsMelinda J Nandini Bogdanski, LPN  16/07/9603/28/16 [id number]

## 2015-02-25 LAB — MICROSCOPIC URINALYSIS
Epithelial Cells UA: 20 /HPF
RBC, UA: 20 /HPF (ref 0–2)
WBC, UA: 0 /HPF (ref 0–5)

## 2015-02-25 LAB — URINALYSIS
Bilirubin Urine: NEGATIVE
Glucose, Ur: NEGATIVE
Ketones, Urine: NEGATIVE
Nitrite, Urine: NEGATIVE
Protein, UA: NEGATIVE
Specific Gravity, UA: 1.02 (ref 1.005–1.030)
Urobilinogen, Urine: NORMAL
pH, UA: 7.5 (ref 5.0–8.0)

## 2015-02-25 LAB — C.TRACHOMATIS N.GONORRHOEAE DNA
C. trachomatis DNA: NEGATIVE
N. gonorrhoeae DNA: NEGATIVE

## 2015-02-25 LAB — VAGINITIS DNA PROBE
Direct Exam: 407.3
Direct Exam: NEGATIVE
Direct Exam: NEGATIVE
Direct Exam: NEGATIVE

## 2015-02-25 LAB — POCT URINE PREGNANCY: HCG, Pregnancy Urine (POC): NEGATIVE

## 2016-04-10 ENCOUNTER — Emergency Department
Admit: 2016-04-10 | Payer: PRIVATE HEALTH INSURANCE | Primary: Student in an Organized Health Care Education/Training Program

## 2016-04-10 ENCOUNTER — Inpatient Hospital Stay
Admit: 2016-04-10 | Discharge: 2016-04-10 | Disposition: A | Discharge status: Home / Self Care | Payer: PRIVATE HEALTH INSURANCE | Attending: Emergency Medicine

## 2016-04-10 DIAGNOSIS — M25572 Pain in left ankle and joints of left foot: Secondary | ICD-10-CM

## 2016-04-10 MED ORDER — IBUPROFEN 600 MG PO TABS
600 MG | ORAL_TABLET | Freq: Four times a day (QID) | ORAL | 0 refills | Status: DC | PRN
Start: 2016-04-10 — End: 2016-06-23

## 2016-04-10 MED ORDER — IBUPROFEN 800 MG PO TABS
800 MG | Freq: Once | ORAL | Status: AC
Start: 2016-04-10 — End: 2016-04-10
  Administered 2016-04-10: 15:00:00 800 mg via ORAL

## 2016-04-10 MED FILL — IBUPROFEN 800 MG PO TABS: 800 MG | ORAL | Qty: 1

## 2016-04-10 NOTE — ED Notes (Signed)
Patient to ed with complaints of left ankle pain. Patient states left foot was asleep and she stood up to walk on it and heard crushing sounds. Patient states pain since then. Patient denies any recent or previous injury to left ankle or foot.      Claudie LeachKrista X Sanjit Mcmichael, RN  04/10/16 1040

## 2016-04-10 NOTE — ED Provider Notes (Signed)
[place] [facility]  eMERGENCY dEPARTMENT eNCOUnter      Pt Name: Kara Freeman  MRN: [id number]  Birthdate 05/31/1991  Date of evaluation: 04/10/2016  PCP:    . None (Inactive)      CHIEF COMPLAINT       Chief Complaint   Patient presents with   ??? Ankle Pain     pain to left ankle since this morning, denies injury, states heard popping when stood out of bed         HISTORY OF PRESENT ILLNESS    Kara Rainbowshley Levert is a 25 y.o. female who presents After an inversion injury.  Patient was sitting with her legs crossed when her legs falsely.  I'm placing that foot down and she was unable to have proprioception and therefore rolled her ankle inward.  Patient has tenderness and pain to the lateral aspect for ankle.  Patient denies other difficulty in terms of other trauma.  Patient has had return of sensation to her leg now.     Location/Symptom: Inversion injury to ankle  Timing/Onset: Just prior to presentation  Context/Setting: Stepped wrong on the foot  Quality: Aching pain to lateral aspect of ankle  Duration: Hours  Modifying Factors: Weightbearing  Severity: Mild-to-moderate      REVIEW OF SYSTEMS       Review of Systems   Constitutional: Negative for activity change and fatigue.   HENT: Negative for sinus pressure and sore throat.    Eyes: Negative for discharge and visual disturbance.   Respiratory: Negative for chest tightness and shortness of breath.    Gastrointestinal: Negative for abdominal pain.   Genitourinary: Negative for dysuria, frequency, vaginal bleeding and vaginal discharge.   Musculoskeletal: Negative for myalgias and neck pain.        Swelling lateral aspect of ankle   Neurological: Negative for dizziness and headaches.   Psychiatric/Behavioral: Negative for dysphoric mood. The patient is not nervous/anxious.        PAST MEDICAL HISTORY    has a past medical history of Herpes.    SURGICAL HISTORY      has a past surgical history that includes Cesarean section.    CURRENT MEDICATIONS        Discharge Medication List as of 04/10/2016 11:05 AM      CONTINUE these medications which have NOT CHANGED    Details   albuterol sulfate HFA (PROVENTIL HFA) 108 (90 BASE) MCG/ACT inhaler Inhale 1-2 puffs into the lungs every 4 hours as needed for Wheezing or Shortness of Breath (Space out to every 6 hours as symptoms improve) Space out to every 6 hours as symptoms improve., Disp-1 Inhaler, R-0      !! ibuprofen (ADVIL;MOTRIN) 600 MG tablet Take 1 tablet by mouth 3 times daily as needed for Pain (Take with food.)., Disp-15 tablet, R-0       !! - Potential duplicate medications found. Please discuss with provider.          ALLERGIES     is allergic to bactrim [sulfamethoxazole-trimethoprim].    FAMILY HISTORY     has no family status information on file.    family history is not on file.    SOCIAL HISTORY      reports that she has never smoked. She does not have any smokeless tobacco history on file. She reports that she does not drink alcohol or use illicit drugs.    PHYSICAL EXAM     INITIAL VITALS:  height is  5\' 6"  (1.676 m) and weight is 181 lb (82.1 kg). Her oral temperature is 97.7 ??F (36.5 ??C). Her blood pressure is 131/77 and her pulse is 86. Her respiration is 16 and oxygen saturation is 96%.      Physical Exam   Constitutional: She is oriented to person, place, and time. She appears well-developed and well-nourished.   HENT:   Head: Normocephalic and atraumatic.   Right Ear: External ear normal.   Left Ear: External ear normal.   Nose: Nose normal.   Eyes: Pupils are equal, round, and reactive to light. Right eye exhibits no discharge. Left eye exhibits no discharge. No scleral icterus.   Neck: Normal range of motion. Neck supple. No tracheal deviation present.   Pulmonary/Chest: Effort normal. No stridor.   Musculoskeletal: She exhibits no edema or tenderness.   Tenderness lateral aspect of ankle.  Swelling lateral aspect of angle.  No fibular head tenderness.  No tenderness to distal portions of foot.    Lymphadenopathy:     She has no cervical adenopathy.   Neurological: She is alert and oriented to person, place, and time. Coordination normal.   Skin: Skin is warm and dry. No rash noted. She is not diaphoretic.   Psychiatric: She has a normal mood and affect. Her behavior is normal.       DIFFERENTIAL DIAGNOSIS/ MDM:          DIAGNOSTIC RESULTS     EKG: All EKG's are interpreted by the Emergency Department Physician who either signs or Co-signs this chart in the absence of a cardiologist.         RADIOLOGY:   I directly visualized the following  images and reviewed the radiologist interpretations:  XR Ankle Left Standard   Final Result   No evidence of acute fracture.             No evidence of fracture      ED BEDSIDE ULTRASOUND:        LABS:  Labs Reviewed - No data to display         EMERGENCY DEPARTMENT COURSE:   Vitals:    Vitals:    04/10/16 1038   BP: 131/77   Pulse: 86   Resp: 16   Temp: 97.7 ??F (36.5 ??C)   TempSrc: Oral   SpO2: 96%   Weight: 181 lb (82.1 kg)   Height: 5\' 6"  (1.676 m)     -------------------------  BP: 131/77, Temp: 97.7 ??F (36.5 ??C), Pulse: 86, Resp: 16         CRITICAL CARE:   None         CONSULTS:         PROCEDURES:  None    FINAL IMPRESSION      1. Acute left ankle pain          DISPOSITION/PLAN   DISPOSITION Decision to Discharge    PATIENT REFERRED TO:  No follow-up provider specified.    DISCHARGE MEDICATIONS:  Discharge Medication List as of 04/10/2016 11:05 AM      START taking these medications    Details   !! ibuprofen (ADVIL;MOTRIN) 600 MG tablet Take 1 tablet by mouth every 6 hours as needed for Pain, Disp-30 tablet, R-0Print       !! - Potential duplicate medications found. Please discuss with provider.          (Please note that portions of this note were completed with a voice recognition program.  Efforts were made  to edit the dictations but occasionally words are mis-transcribed.)    Francena Hanly,, MD  Attending Emergency Physician                   Francena Hanly,  MD  04/10/16 [phone]

## 2016-04-10 NOTE — ED Provider Notes (Signed)
PATIENT EVALUATED AND TREATED BY DR. Barnetta ChapelLEDRICK  PATIENT WAS NOT SEEN BY ME.     Rosealee AlbeeMichael S Capri Raben, MD  04/10/16 1102

## 2016-04-10 NOTE — ED Notes (Signed)
Patient provided with crutches and aircast. Patient states proper use of crutches.     Claudie LeachKrista X Danyla Wattley, RN  04/10/16 1124

## 2016-04-19 ENCOUNTER — Inpatient Hospital Stay
Admit: 2016-04-19 | Discharge: 2016-04-19 | Disposition: A | Discharge status: Home / Self Care | Payer: PRIVATE HEALTH INSURANCE | Attending: Emergency Medicine

## 2016-04-19 DIAGNOSIS — S01511A Laceration without foreign body of lip, initial encounter: Secondary | ICD-10-CM

## 2016-04-19 MED ORDER — ACETAMINOPHEN-CODEINE 300-30 MG PO TABS
300-30 MG | ORAL_TABLET | Freq: Three times a day (TID) | ORAL | 0 refills | Status: DC | PRN
Start: 2016-04-19 — End: 2016-06-23

## 2016-04-19 MED ORDER — TETANUS-DIPHTH-ACELL PERTUSSIS 5-2.5-18.5 LF-MCG/0.5 IM SUSP
Freq: Once | INTRAMUSCULAR | Status: AC
Start: 2016-04-19 — End: 2016-04-19
  Administered 2016-04-19: 0.5 mL via INTRAMUSCULAR

## 2016-04-19 MED ORDER — LIDOCAINE HCL 1 % IJ SOLN
1 % | Freq: Once | INTRAMUSCULAR | Status: AC
Start: 2016-04-19 — End: 2016-04-19
  Administered 2016-04-19: 20 mL via INTRADERMAL

## 2016-04-19 MED ORDER — DIPHENHYDRAMINE HCL 50 MG/ML IJ SOLN
50 MG/ML | Freq: Once | INTRAMUSCULAR | Status: DC
Start: 2016-04-19 — End: 2016-04-19

## 2016-04-19 MED ORDER — CEPHALEXIN 500 MG PO CAPS
500 MG | ORAL_CAPSULE | Freq: Three times a day (TID) | ORAL | 0 refills | Status: DC
Start: 2016-04-19 — End: 2017-12-24

## 2016-04-19 MED FILL — BOOSTRIX 5-2.5-18.5 IM SUSP: INTRAMUSCULAR | Qty: 0.5

## 2016-04-19 MED FILL — LIDOCAINE HCL 1 % IJ SOLN: 1 % | INTRAMUSCULAR | Qty: 20

## 2016-04-19 NOTE — ED Notes (Signed)
[facility]     Nawaal Alling, RN  04/19/16 [phone]
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
## 2016-06-23 ENCOUNTER — Inpatient Hospital Stay
Admit: 2016-06-23 | Discharge: 2016-06-23 | Disposition: A | Discharge status: Home / Self Care | Payer: PRIVATE HEALTH INSURANCE

## 2016-06-23 DIAGNOSIS — A6000 Herpesviral infection of urogenital system, unspecified: Secondary | ICD-10-CM

## 2016-06-23 LAB — UA W/REFLEX CULTURE
Bilirubin Urine: NEGATIVE
Glucose, Ur: NEGATIVE
Ketones, Urine: NEGATIVE
Nitrite, Urine: POSITIVE — AB
Specific Gravity, UA: 1.025 (ref 1.005–1.030)
Urobilinogen, Urine: ELEVATED — AB
pH, UA: 6.5 (ref 5.0–8.0)

## 2016-06-23 LAB — MICROSCOPIC URINALYSIS
Epithelial Cells UA: 10 /HPF
RBC, UA: 2 /HPF (ref 0–2)
WBC, UA: 20 /HPF (ref 0–5)

## 2016-06-23 LAB — POCT URINE PREGNANCY: Preg Test, Ur: NEGATIVE

## 2016-06-23 LAB — POCT URINALYSIS DIPSTICK
Nitrite, UA: POSITIVE
Protein, UA POC: 30
Spec Grav, UA: 1.025
Urobilinogen, UA: 1
pH, UA: 6.5

## 2016-06-23 LAB — POC GLUCOSE FINGERSTICK: POC Glucose: 81 mg/dL (ref 65–105)

## 2016-06-23 MED ORDER — LIDOCAINE HCL 1 % IJ SOLN
1 % | Freq: Once | INTRAMUSCULAR | Status: AC
Start: 2016-06-23 — End: 2016-06-23
  Administered 2016-06-23: 18:00:00 20 mL via INTRADERMAL

## 2016-06-23 MED ORDER — PHENAZOPYRIDINE HCL 200 MG PO TABS
200 MG | ORAL_TABLET | Freq: Three times a day (TID) | ORAL | 0 refills | Status: AC | PRN
Start: 2016-06-23 — End: 2016-06-26

## 2016-06-23 MED ORDER — HYDROCODONE-ACETAMINOPHEN 5-325 MG PO TABS
5-325 MG | ORAL_TABLET | Freq: Two times a day (BID) | ORAL | 0 refills | Status: AC
Start: 2016-06-23 — End: 2016-06-28

## 2016-06-23 MED ORDER — CEFTRIAXONE SODIUM 1 G IJ SOLR
1 g | Freq: Once | INTRAMUSCULAR | Status: AC
Start: 2016-06-23 — End: 2016-06-23
  Administered 2016-06-23: 18:00:00 1 g via INTRAMUSCULAR

## 2016-06-23 MED ORDER — CIPROFLOXACIN HCL 500 MG PO TABS
500 MG | ORAL_TABLET | Freq: Two times a day (BID) | ORAL | 0 refills | Status: AC
Start: 2016-06-23 — End: 2016-07-03

## 2016-06-23 MED FILL — CEFTRIAXONE SODIUM 1 G IJ SOLR: 1 g | INTRAMUSCULAR | Qty: 1

## 2016-06-23 MED FILL — LIDOCAINE HCL 1 % IJ SOLN: 1 % | INTRAMUSCULAR | Qty: 20

## 2016-06-23 NOTE — Discharge Instructions (Signed)
Take meds as prescribed.  Follow up with doctor or [facility] in 3 -4 days.  Return to ER immediately if symptoms worsen or persist.  Pyridium will turn your urine orangish/red, this is normal.  Do not drive, operate machinery, climb or engage in any dangerous activity while taking narcotics or norco.      [facility]  [phone] Mon, Tues, Thurs, Fri    10a - 4p  Wed - 1p - 4p  (closed from 12p noon - 1p      Genital Herpes: Care Instructions  Your Care Instructions  Genital herpes is caused by a virus called herpes simplex. There are two types of this virus. Type 2 is the type that usually causes genital herpes. But type 1 can also cause it. Type 1 is the type that causes cold sores.  Genital herpes is a sexually transmitted infection (STI). The most common way to get it is through sexual or other contact with someone who has herpes. For example, the virus can spread from a sore in the genital area to the lips. And it can spread from a cold sore on the lips to the genital area.  Some people are surprised to find out that they have herpes or that they gave it to someone else. This is because a lot of people who have it don't know that they have it. They may not get sores or they may have sores that they cannot see. But the virus can still be spread by a person who does not have obvious sores or symptoms.  There is no cure for herpes, but antiviral medicine can help you feel better and help prevent more outbreaks. This medicine may also lower the chance of spreading the virus.  Follow-up care is a key part of your treatment and safety. Be sure to make and go to all appointments, and call your doctor if you are having problems. It's also a good idea to know your test results and keep a list of the medicines you take.  How can you care for yourself at home?   Be safe with medicines. If your doctor prescribes medicine, take it exactly as prescribed. Call your doctor if you think you are having a  problem with your medicine. You will get more details on the specific medicines your doctor prescribes.   To reduce the pain and itching from herpes sores:   Wash the area with water 3 or 4 times a day.   Keep the sores clean and dry in between baths or showers. You may want to let the sores air dry. This may feel better than a towel.   Wear cotton underwear. Cotton absorbs moisture well.   Take an over-the-counter pain medicine, such as acetaminophen (Tylenol), ibuprofen (Advil, Motrin), or naproxen (Aleve). Read and follow all instructions on the label. Do not take two or more pain medicines at the same time unless the doctor told you to. Many pain medicines have acetaminophen, which is Tylenol. Too much acetaminophen (Tylenol) can be harmful.  To prevent the spread of genital herpes   Latex condoms are a good way to reduce the risk of spreading the virus. But you can still get the virus even if you use a condom. For the best protection, use condoms every time you have sex, from the beginning to the end of sexual contact. Remember that you can infect someone even if you do not have obvious symptoms or sores.   Avoid sexual  contact when you have symptoms. Symptoms include sores, tingling, or pain.   Wash your hands if you touch a sore. In some cases, especially if this is your first herpes outbreak, you can spread the virus to other parts of your body or to other people.   Having one sex partner (who does not have STIs and does not have sex with anyone else) is a good way to avoid STIs.   Talk to your sex partner or partners about genital herpes.   Encourage your sex partners to get a blood test to see if they have been infected.  When should you call for help?  Call your doctor now or seek immediate medical care if:   You have a new fever.   There is increasing redness or red streaks around herpes sores.  Watch closely for changes in your health, and be sure to contact your doctor if:   You have  herpes and you think you might be pregnant.   You have an outbreak of herpes sores, and the sores are not healing.   You have frequent outbreaks of genital herpes sores.   You are unable to pass urine or are constipated.   You want to start antiviral medicine.   You do not get better as expected.  Where can you learn more?  Go to [email or web address] and sign in to your MyChart account. Enter [phone] in the Search Health Information box to learn more about "Genital Herpes: Care Instructions."     If you do not have an account, please click on the "Sign Up Now" link.  Current as of: May 09, 2015  Content Version: 11.2   2006-2017 [employer]. Care instructions adapted under license by [facility]. If you have questions about a medical condition or this instruction, always ask your healthcare professional. [employer] disclaims any warranty or liability for your use of this information.         Urinary Tract Infection in Women: Care Instructions  Your Care Instructions    A urinary tract infection, or UTI, is a general term for an infection anywhere between the kidneys and the urethra (where urine comes out). Most UTIs are bladder infections. They often cause pain or burning when you urinate.  UTIs are caused by bacteria and can be cured with antibiotics. Be sure to complete your treatment so that the infection goes away.  Follow-up care is a key part of your treatment and safety. Be sure to make and go to all appointments, and call your doctor if you are having problems. It's also a good idea to know your test results and keep a list of the medicines you take.  How can you care for yourself at home?   Take your antibiotics as directed. Do not stop taking them just because you feel better. You need to take the full course of antibiotics.   Drink extra water and other fluids for the next day or two. This may help wash out the bacteria that are causing the infection.  (If you have kidney, heart, or liver disease and have to limit fluids, talk with your doctor before you increase your fluid intake.)   Avoid drinks that are carbonated or have caffeine. They can irritate the bladder.   Urinate often. Try to empty your bladder each time.   To relieve pain, take a hot bath or lay a heating pad set on low over your lower belly or genital area. Never go to sleep with  a heating pad in place.  To prevent UTIs   Drink plenty of water each day. This helps you urinate often, which clears bacteria from your system. (If you have kidney, heart, or liver disease and have to limit fluids, talk with your doctor before you increase your fluid intake.)   Urinate when you need to.   Urinate right after you have sex.   Change sanitary pads often.   Avoid douches, bubble baths, feminine hygiene sprays, and other feminine hygiene products that have deodorants.   After going to the bathroom, wipe from front to back.  When should you call for help?  Call your doctor now or seek immediate medical care if:   Symptoms such as fever, chills, nausea, or vomiting get worse or appear for the first time.   You have new pain in your back just below your rib cage. This is called flank pain.   There is new blood or pus in your urine.   You have any problems with your antibiotic medicine.  Watch closely for changes in your health, and be sure to contact your doctor if:   You are not getting better after taking an antibiotic for 2 days.   Your symptoms go away but then come back.  Where can you learn more?  Go to [email or web address] and sign in to your MyChart account. Enter [phone] in the Search Health Information box to learn more about "Urinary Tract Infection in Women: Care Instructions."     If you do not have an account, please click on the "Sign Up Now" link.  Current as of: September 26, 2015  Content Version: 11.2   2006-2017 [employer]. Care instructions adapted  under license by [facility]. If you have questions about a medical condition or this instruction, always ask your healthcare professional. [employer] disclaims any warranty or liability for your use of this information.

## 2016-06-23 NOTE — ED Notes (Signed)
Pt to ED with vaginal discharge and urinary frequency, and is currently having a herpes outbreak, pt has been treated for UTI recently.     Toma Aran, RN  06/23/16 1308

## 2016-06-23 NOTE — ED Notes (Signed)
No allergic reaction noted from IM Rocephin.     Wilford Sports, LPN  36/64/40 [id number]

## 2016-06-23 NOTE — ED Provider Notes (Signed)
[facility] ED  eMERGENCY dEPARTMENT eNCOUnter      Pt Name: Kara Freeman  MRN: [id number]  Birthdate 07/01/1991  Date of evaluation: 06/23/2016  Provider: Raoul Pitch, PA-C    CHIEF COMPLAINT       Chief Complaint   Patient presents with   ??? Vaginal Discharge     flagyl started 1 week ago   ??? Herpes Zoster     treatment started 4 days ago with valtrex   ??? Urinary Tract Infection     started on bactrim yesterday         HISTORY OF PRESENT ILLNESS  (Location/Symptom, Timing/Onset, Context/Setting, Quality, Duration, Modifying Factors, Severity.)   Kara Freeman is a 25 y.o. female who presents to the emergency department with Dysuria and burning in the genital region for the last 4 days.  Patient has a history of UTIs in the past and also genital herpes.  Could not see her ObGyn.  Therefore she called the office and was called in Flagyl, Valtrex, Bactrim.  She has been taking the Valtrex for roughly 3 days.  Started the Bactrim yesterday and has only had 1 pill.  Has never actually been seen or evaluated for this.  Her Melrose Nakayama is out of town and  these prescriptions were called in over the phone.  She does have a coming appointment on Friday with her ObGyn.  No nausea, vomiting.  No fevers or chills.  No other complaints.  Symptoms are described as moderate, constant, burning.  Symptoms were of urination.  No alleviating factors.      Nursing Notes were reviewed.    ALLERGIES     Review of patient's allergies indicates no known allergies.    CURRENT MEDICATIONS       Previous Medications    ALBUTEROL SULFATE HFA (PROVENTIL HFA) 108 (90 BASE) MCG/ACT INHALER    Inhale 1-2 puffs into the lungs every 4 hours as needed for Wheezing or Shortness of Breath (Space out to every 6 hours as symptoms improve) Space out to every 6 hours as symptoms improve.    CEPHALEXIN (KEFLEX) 500 MG CAPSULE    Take 1 capsule by mouth 3 times daily    METRONIDAZOLE (FLAGYL) 500 MG TABLET    Take 500 mg by mouth 2 times daily     VALACYCLOVIR (VALTREX) 500 MG TABLET    Take 500 mg by mouth 2 times daily       PAST MEDICAL HISTORY         Diagnosis Date   ??? Herpes        SURGICAL HISTORY           Procedure Laterality Date   ??? CESAREAN SECTION           FAMILY HISTORY     History reviewed. No pertinent family history.  No family status information on file.        SOCIAL HISTORY      reports that she has never smoked. She does not have any smokeless tobacco history on file. She reports that she does not drink alcohol or use illicit drugs.    REVIEW OF SYSTEMS    (2-9 systems for level 4, 10 or more for level 5)   Review of Systems    Except as noted above the remainder of the review of systems was reviewed and negative.     PHYSICAL EXAM    (up to 7 for level 4, 8 or more for level  5)   ED Triage Vitals   BP Temp Temp Source Pulse Resp SpO2 Height Weight   06/23/16 1234 06/23/16 1234 06/23/16 1234 06/23/16 1234 06/23/16 1234 06/23/16 1234 06/23/16 1234 06/23/16 1234   122/71 97.8 ??F (36.6 ??C) Oral 81 16 99 % 5\' 6"  (1.676 m) 200 lb 1.6 oz (90.8 kg)     Physical Exam   Constitutional: She is oriented to person, place, and time. She appears well-developed.   HENT:   Head: Normocephalic and atraumatic.   Neck: Normal range of motion. Neck supple.   Cardiovascular: Normal rate and regular rhythm.    Pulmonary/Chest: Effort normal and breath sounds normal.   Abdominal: Soft. She exhibits no distension. There is no tenderness.   Genitourinary: Vaginal discharge found.       Musculoskeletal: Normal range of motion.   Neurological: She is alert and oriented to person, place, and time.   Skin: Skin is warm. No rash noted.   Psychiatric: She has a normal mood and affect. Her behavior is normal.               DIAGNOSTIC RESULTS     EKG: All EKG's are interpreted by the Emergency Department Physician who either signs or Co-signs this chart in the absence of a cardiologist.        RADIOLOGY:   Non-plain film images such as CT, Ultrasound and MRI are read by  the radiologist. Plain radiographic images are visualized and preliminarily interpreted by the emergency physician with the below findings:        Interpretation per the Radiologist below, if available at the time of this note:          ED BEDSIDE ULTRASOUND:   Performed by ED Physician - none    LABS:  Labs Reviewed   UA W/REFLEX CULTURE - Abnormal; Notable for the following:        Result Value    Color, UA STRAW (*)     Turbidity UA SLIGHTLY CLOUDY (*)     Urine Hgb 2+ (*)     Protein, UA 1+ (*)     Urobilinogen, Urine ELEVATED (*)     Nitrite, Urine POSITIVE (*)     Leukocyte Esterase, Urine MOD (*)     All other components within normal limits   MICROSCOPIC URINALYSIS - Abnormal; Notable for the following:     Bacteria, UA FEW (*)     Yeast, UA FEW (*)     All other components within normal limits   POCT URINE PREGNANCY - Normal   POCT URINALYSIS DIPSTICK - Normal   C.TRACHOMATIS N.GONORRHOEAE DNA, URINE   HERPES SIMPLEX VIRUS CULTURE   URINE CULTURE CLEAN CATCH   POC GLUCOSE FINGERSTICK       All other labs were within normal range or not returned as of this dictation.    EMERGENCY DEPARTMENT COURSE and DIFFERENTIAL DIAGNOSIS/MDM:   Vitals:    Vitals:    06/23/16 1234   BP: 122/71   Pulse: 81   Resp: 16   Temp: 97.8 ??F (36.6 ??C)   TempSrc: Oral   SpO2: 99%   Weight: 200 lb 1.6 oz (90.8 kg)   Height: 5\' 6"  (1.676 m)     Patient has a urinary tract infection.  She was given 1 g of Rocephin to treat this.  She is on the appropriate medications for genital herpes outbreak.  She will continue this Valtrex.  I will switch her to Cipro from Bactrim  as patient reports better treatment in the past with Cipro as opposed to Bactrim.  We'll also get Pyridium.  Patient will be given a small course of pain medicine and discharged home.  She is very comfortable with this plan of care.    CONSULTS:  None    PROCEDURES:  Procedures        FINAL IMPRESSION      1. Genital herpes simplex, unspecified site    2. Urinary tract  infection without hematuria, site unspecified          DISPOSITION/PLAN   DISPOSITION Decision to Discharge    PATIENT REFERRED TO:   obgyn    In 2 days        DISCHARGE MEDICATIONS:     New Prescriptions    CIPROFLOXACIN (CIPRO) 500 MG TABLET    Take 1 tablet by mouth 2 times daily for 10 days    HYDROCODONE-ACETAMINOPHEN (NORCO) 5-325 MG PER TABLET    Take 1 tablet by mouth 2 times daily for 5 days .    PHENAZOPYRIDINE (PYRIDIUM) 200 MG TABLET    Take 1 tablet by mouth 3 times daily as needed for Pain (bladder spasm/pain)           (Please note that portions of this note were completed with a voice recognition program.  Efforts were made to edit the dictations but occasionally words are mis-transcribed.)    Raoul PitchMatthew Christopher Chassidy Layson, PA-C            Raoul PitchMatthew Christopher Milo Solana, PA-C  06/23/16 1408

## 2016-06-24 LAB — URINE CULTURE CLEAN CATCH: Culture: NO GROWTH

## 2016-06-24 LAB — POCT URINE PREGNANCY: HCG, Pregnancy Urine (POC): NEGATIVE

## 2016-06-25 LAB — C.TRACHOMATIS N.GONORRHOEAE DNA, URINE
C. trachomatis DNA ,Urine: NEGATIVE
N. gonorrhoeae DNA, Urine: NEGATIVE

## 2016-06-27 LAB — CULTURE, HSV

## 2016-09-17 ENCOUNTER — Inpatient Hospital Stay
Admit: 2016-09-17 | Discharge: 2016-09-17 | Disposition: A | Discharge status: Home / Self Care | Payer: PRIVATE HEALTH INSURANCE | Attending: Emergency Medicine

## 2016-09-17 ENCOUNTER — Emergency Department
Admit: 2016-09-17 | Payer: PRIVATE HEALTH INSURANCE | Primary: Student in an Organized Health Care Education/Training Program

## 2016-09-17 DIAGNOSIS — S0083XA Contusion of other part of head, initial encounter: Secondary | ICD-10-CM

## 2016-09-17 LAB — POCT URINE PREGNANCY: Preg Test, Ur: NEGATIVE

## 2016-09-17 MED ORDER — HYDROCODONE-ACETAMINOPHEN 5-325 MG PO TABS
5-325 MG | ORAL_TABLET | Freq: Four times a day (QID) | ORAL | 0 refills | Status: AC | PRN
Start: 2016-09-17 — End: 2016-09-24

## 2016-09-17 MED ORDER — IBUPROFEN 800 MG PO TABS
800 MG | Freq: Once | ORAL | Status: AC
Start: 2016-09-17 — End: 2016-09-17
  Administered 2016-09-17: 17:00:00 800 mg via ORAL

## 2016-09-17 MED ORDER — IBUPROFEN 800 MG PO TABS
800 MG | ORAL_TABLET | Freq: Three times a day (TID) | ORAL | 0 refills | Status: DC | PRN
Start: 2016-09-17 — End: 2017-12-24

## 2016-09-17 MED FILL — IBUPROFEN 800 MG PO TABS: 800 MG | ORAL | Qty: 1

## 2016-09-17 NOTE — ED Provider Notes (Addendum)
The patient was seen and examined by me in conjunction with the mid-level provider.  I agree with his/her assessment and treatment plan.    X-rays per radiology showed no acute findings.       Cherie OuchJeffery Nalina Yeatman, MD  09/17/16 [phone]
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
## 2016-09-17 NOTE — ED Notes (Signed)
TPD here to speak with pt.       Schuyler AmorBobbie Jo Micajah Dennin, LPN  16/07/9610/20/17 [id number]

## 2016-09-17 NOTE — Discharge Instructions (Signed)
Take meds as prescribed.  Follow outpatient tomorrow with doctor or by calling [phone] or [phone].   Return to ER immediately if symptoms worsen or persist.  Do not drive, operate machinery, climb or engage in any dangerous activity while taking narcotics or norco.  Call 911 i immediately f you feel you are ever any danger

## 2016-09-18 LAB — POCT URINE PREGNANCY: HCG, Pregnancy Urine (POC): NEGATIVE

## 2017-01-18 ENCOUNTER — Inpatient Hospital Stay: Admit: 2017-01-18 | Discharge: 2017-01-18 | Payer: PRIVATE HEALTH INSURANCE

## 2017-01-18 DIAGNOSIS — Z5329 Procedure and treatment not carried out because of patient's decision for other reasons: Secondary | ICD-10-CM

## 2017-06-10 LAB — OB RESULTS CONSOLE HEPATITIS B SURFACE ANTIGEN: Hepatitis B Surface Ag: NEGATIVE

## 2017-06-10 LAB — OB RESULTS CONSOLE ABO/RH: RH TYPE: POSITIVE

## 2017-06-10 LAB — OB RESULTS CONSOLE HIV ANTIBODY (ROUTINE TESTING): HIV: NONREACTIVE

## 2017-06-10 LAB — OB RESULTS CONSOLE GC/CHLAMYDIA
Chlamydia: NEGATIVE
Gonorrhea: NEGATIVE

## 2017-06-10 LAB — OB RESULTS CONSOLE RUBELLA ANTIBODY, IGM: Rubella: IMMUNE

## 2017-06-10 LAB — OB RESULTS CONSOLE RPR: RPR: NONREACTIVE

## 2017-06-10 LAB — OB RESULTS CONSOLE ANTIBODY SCREEN: ANTIBODY SCREEN: NEGATIVE

## 2017-06-14 ENCOUNTER — Encounter: Payer: Self-pay | Admitting: Registered"

## 2017-06-14 ENCOUNTER — Encounter: Payer: PRIVATE HEALTH INSURANCE | Attending: Cardiology | Admitting: Registered"

## 2017-06-14 DIAGNOSIS — Z713 Dietary counseling and surveillance: Secondary | ICD-10-CM | POA: Diagnosis not present

## 2017-06-14 NOTE — Progress Notes (Signed)
Medical Nutrition Therapy:  Appt start time: 1000 end time:  1100.  Assessment:  Primary concerns today:  Pt states she originally asked for a dietitian referral because she was having trouble conceiving and her doctor thought losing some weight may help improve fertility. Since then she has become pregnant and now has a goal of improving her diet and maintaining a healthy weight. Pt states also wants to improve diet due to recently finding out she has fatty liver.  Pt states that she has issues with fatigue and also inquired about what can be done about increasing energy. Pt states she has not had her vitamin D levels check.   Due to PCOS with a question mark in referral paperwork, RD went through some assessment questions; PCOS is likely in this patient. Patient provided information about blood tests and results and states her doctor was waiting for her to have a period to test of polycystic ovaries.  Testosterone: elevated (per pt)  LH-to-FSH ratio: elevated (per pt) Menstruation: painful, heavy, but regular Birth Control: started OCP at 26 yrs old for painful periods. OCP caused amenorrhea until she stopped taking it when trying to become pregnant. Acne: yes, more as a teenager and mostly on back and chest. Body hair: no Thinning scalp hair: no Weight changes (midsection?): belly & legs - started gaining weight around 26 yrs old Acanthosis nigricans: armpits Carb Cravings: pasta & bread (stays away from now and seems to have subsided)  Preferred Learning Style:   No preference indicated   Learning Readiness:   Ready  MEDICATIONS: reviewed   DIETARY INTAKE:  Avoided foods include: Onions, GI issues.    24-hr recall:  B ( AM): fruit OR smoothie with juice OR protein bar Snk ( AM): almonds OR saltines  L ( PM): spinach salad w grilled chicken Snk ( PM): greek yogurt OR fruit D ( PM): grilled salmon or chicken, broccoli Snk ( PM): veggie straws Beverages: water  Usual  physical activity: 30 min day walking, on feet at work, some yoga  Estimated energy needs: 2000 calories 225 g carbohydrates 150 g protein 56 g fat  Progress Towards Goal(s):  In progress.   Nutritional Diagnosis:  NI-5.8.4 Inconsistent carbohydrate intake As related to fruit alone for breakfast and/or snacks.  As evidenced by diet recall.    Intervention:  Nutrition Education. Discussed changes in hormones caused by PCOS. Discussed risks for gestational diabetes and ways to avoid high blood sugar issues during pregnancy. Discussed balanced eating. Discussed ways that the body lets [place] know we need to eat. Discussed how diet can effect energy levels as well as if there are vitamin deficiencies.  Teaching Method Utilized:  Visual Auditory  Handouts given during visit include:  My Plate Planner  Barriers to learning/adherence to lifestyle change: none  Demonstrated degree of understanding via:  Teach Back   Monitoring/Evaluation:  Dietary intake, exercise, and body weight prn.

## 2017-06-14 NOTE — Patient Instructions (Addendum)
Consider pre-natal yoga Consider adding protein powder to smoothies When eating out try [email or web address].com or the restaurants website  Aim to balance meals with protein, carbs and plenty of vegetables. Ask doctor to check vit D level, and fasting glucose

## 2017-09-02 ENCOUNTER — Other Ambulatory Visit (HOSPITAL_COMMUNITY): Payer: Self-pay | Admitting: Obstetrics and Gynecology

## 2017-09-02 DIAGNOSIS — O99212 Obesity complicating pregnancy, second trimester: Secondary | ICD-10-CM

## 2017-09-02 DIAGNOSIS — Z3A19 19 weeks gestation of pregnancy: Secondary | ICD-10-CM

## 2017-09-02 DIAGNOSIS — Z3689 Encounter for other specified antenatal screening: Secondary | ICD-10-CM

## 2017-09-04 ENCOUNTER — Encounter (HOSPITAL_COMMUNITY): Payer: Self-pay | Admitting: *Deleted

## 2017-09-05 ENCOUNTER — Ambulatory Visit (HOSPITAL_COMMUNITY)
Admission: RE | Admit: 2017-09-05 | Discharge: 2017-09-05 | Disposition: A | Discharge status: Home / Self Care | Payer: PRIVATE HEALTH INSURANCE | Source: Ambulatory Visit | Attending: Obstetrics and Gynecology | Admitting: Obstetrics and Gynecology

## 2017-09-05 ENCOUNTER — Encounter (HOSPITAL_COMMUNITY): Payer: Self-pay

## 2017-09-05 DIAGNOSIS — Z6841 Body Mass Index (BMI) 40.0 and over, adult: Secondary | ICD-10-CM | POA: Insufficient documentation

## 2017-09-05 DIAGNOSIS — Z3689 Encounter for other specified antenatal screening: Secondary | ICD-10-CM | POA: Diagnosis not present

## 2017-09-05 DIAGNOSIS — Z3A19 19 weeks gestation of pregnancy: Secondary | ICD-10-CM | POA: Insufficient documentation

## 2017-09-05 DIAGNOSIS — O99212 Obesity complicating pregnancy, second trimester: Secondary | ICD-10-CM | POA: Diagnosis not present

## 2017-09-05 HISTORY — DX: Stricture of artery: I77.1

## 2017-09-05 HISTORY — DX: Fatty (change of) liver, not elsewhere classified: K76.0

## 2017-09-05 HISTORY — DX: Cardiomyopathy, unspecified: I42.9

## 2017-09-06 ENCOUNTER — Other Ambulatory Visit (HOSPITAL_COMMUNITY): Payer: PRIVATE HEALTH INSURANCE

## 2017-09-11 ENCOUNTER — Encounter (HOSPITAL_COMMUNITY): Payer: Self-pay

## 2017-10-29 NOTE — L&D Delivery Note (Signed)
Delivery Note Pt pushed very well for about 40 min for delivery,  At 7:04 PM a viable and healthy female was delivered via Vaginal, Spontaneous (Presentation: OA; LOT ).  APGAR: 6, 9; weight P .   Placenta status: delivered, intact .  Cord: 3V with the following complications: shoulder nuchal.    Anesthesia:  epidural Episiotomy: None Lacerations: 1st degree perineal;Periurethral Suture Repair: 3.0 vicryl Est. Blood Loss (mL): 200cc  Mom to postpartum.  Baby to Couplet care / Skin to Skin.  Yvette Hart 01/26/2018, 7:35 PM  Br/RI/Tdap in [facility]/O+/Contra?  D/W pt r/b/a of circumcision for female infant, wish to proceed.

## 2017-12-24 ENCOUNTER — Inpatient Hospital Stay
Admit: 2017-12-24 | Discharge: 2017-12-24 | Disposition: A | Discharge status: Home / Self Care | Payer: PRIVATE HEALTH INSURANCE | Attending: Emergency Medicine

## 2017-12-24 ENCOUNTER — Emergency Department
Admit: 2017-12-24 | Payer: PRIVATE HEALTH INSURANCE | Primary: Student in an Organized Health Care Education/Training Program

## 2017-12-24 DIAGNOSIS — J101 Influenza due to other identified influenza virus with other respiratory manifestations: Secondary | ICD-10-CM

## 2017-12-24 LAB — CBC WITH AUTO DIFFERENTIAL
Absolute Eos #: 0 10*3/uL (ref 0.0–0.4)
Absolute Lymph #: 1 10*3/uL (ref 1.0–4.8)
Absolute Mono #: 0.4 10*3/uL (ref 0.2–0.8)
Basophils Absolute: 0 10*3/uL (ref 0.0–0.2)
Basophils: 1 % (ref 0–2)
Eosinophils %: 1 % (ref 1–4)
Hematocrit: 42.2 % (ref 36–46)
Hemoglobin: 13.8 g/dL (ref 12.0–16.0)
Lymphocytes: 16 % — ABNORMAL LOW (ref 24–44)
MCH: 28.9 pg (ref 26–34)
MCHC: 32.6 g/dL (ref 31–37)
MCV: 88.5 fL (ref 80–100)
Monocytes: 7 % (ref 1–7)
Platelets: 264 10*3/uL (ref 130–400)
RBC: 4.77 m/uL (ref 4.0–5.2)
RDW: 12.2 % (ref 11.5–14.5)
Seg Neutrophils: 75 % — ABNORMAL HIGH (ref 36–66)
Segs Absolute: 4.6 10*3/uL (ref 1.8–7.7)
WBC: 6 10*3/uL (ref 3.5–11.0)

## 2017-12-24 LAB — URINALYSIS
Bilirubin Urine: NEGATIVE
Glucose, Ur: NEGATIVE
Ketones, Urine: NEGATIVE
Leukocyte Esterase, Urine: NEGATIVE
Nitrite, Urine: NEGATIVE
Protein, UA: NEGATIVE
Specific Gravity, UA: 1.01 (ref 1.005–1.030)
Urobilinogen, Urine: NORMAL
pH, UA: 7.5 (ref 5.0–8.0)

## 2017-12-24 LAB — BASIC METABOLIC PANEL
Anion Gap: 14 mmol/L (ref 9–17)
BUN: 6 mg/dL (ref 6–20)
Bun/Cre Ratio: 9 (ref 9–20)
CO2: 23 mmol/L (ref 20–31)
Calcium: 9.3 mg/dL (ref 8.6–10.4)
Chloride: 101 mmol/L (ref 98–107)
Creatinine: 0.69 mg/dL (ref 0.50–0.90)
GFR African American: >60 mL/min (ref >60)
GFR Non-African American: >60 mL/min (ref >60)
Glucose: 97 mg/dL (ref 70–99)
Potassium: 4 mmol/L (ref 3.7–5.3)
Sodium: 138 mmol/L (ref 135–144)

## 2017-12-24 LAB — MICROSCOPIC URINALYSIS
Epithelial Cells UA: 10 /HPF (ref 0–5)
RBC, UA: 5 /HPF (ref 0–2)
WBC, UA: 2 /HPF (ref 0–5)

## 2017-12-24 LAB — RAPID INFLUENZA A/B ANTIGENS
Direct Exam: NEGATIVE
Direct Exam: POSITIVE — AB

## 2017-12-24 LAB — POCT URINE PREGNANCY
HCG, Pregnancy Urine (POC): NEGATIVE
Preg Test, Ur: NEGATIVE

## 2017-12-24 MED ORDER — HYDROCODONE-ACETAMINOPHEN 5-325 MG PO TABS
5-325 MG | ORAL_TABLET | Freq: Four times a day (QID) | ORAL | 0 refills | Status: AC | PRN
Start: 2017-12-24 — End: 2017-12-29

## 2017-12-24 MED ORDER — OSELTAMIVIR PHOSPHATE 75 MG PO CAPS
75 MG | ORAL_CAPSULE | Freq: Two times a day (BID) | ORAL | 0 refills | Status: AC
Start: 2017-12-24 — End: 2017-12-29

## 2017-12-24 MED ORDER — OSELTAMIVIR PHOSPHATE 75 MG PO CAPS
75 MG | Freq: Once | ORAL | Status: AC
Start: 2017-12-24 — End: 2017-12-24
  Administered 2017-12-24: 10:00:00 75 mg via ORAL

## 2017-12-24 MED ORDER — ONDANSETRON 4 MG PO TBDP
4 MG | ORAL_TABLET | ORAL | 0 refills | Status: DC | PRN
Start: 2017-12-24 — End: 2020-10-27

## 2017-12-24 MED ORDER — ONDANSETRON HCL 4 MG/2ML IJ SOLN
4 MG/2ML | Freq: Once | INTRAMUSCULAR | Status: AC
Start: 2017-12-24 — End: 2017-12-24
  Administered 2017-12-24: 09:00:00 8 mg via INTRAVENOUS

## 2017-12-24 MED ORDER — ACETAMINOPHEN 500 MG PO TABS
500 MG | Freq: Once | ORAL | Status: AC
Start: 2017-12-24 — End: 2017-12-24
  Administered 2017-12-24: 09:00:00 1000 mg via ORAL

## 2017-12-24 MED ORDER — SODIUM CHLORIDE 0.9 % IV BOLUS
0.9 % | Freq: Once | INTRAVENOUS | Status: AC
Start: 2017-12-24 — End: 2017-12-24
  Administered 2017-12-24: 09:00:00 1000 mL via INTRAVENOUS

## 2017-12-24 MED ORDER — IBUPROFEN 800 MG PO TABS
800 MG | ORAL_TABLET | Freq: Three times a day (TID) | ORAL | 0 refills | Status: DC | PRN
Start: 2017-12-24 — End: 2020-10-27

## 2017-12-24 MED ORDER — KETOROLAC TROMETHAMINE 30 MG/ML IJ SOLN
30 MG/ML | Freq: Once | INTRAMUSCULAR | Status: AC
Start: 2017-12-24 — End: 2017-12-24
  Administered 2017-12-24: 09:00:00 30 mg via INTRAVENOUS

## 2017-12-24 MED FILL — ONDANSETRON HCL 4 MG/2ML IJ SOLN: 4 MG/2ML | INTRAMUSCULAR | Qty: 4

## 2017-12-24 MED FILL — OSELTAMIVIR PHOSPHATE 75 MG PO CAPS: 75 mg | ORAL | Qty: 1

## 2017-12-24 MED FILL — KETOROLAC TROMETHAMINE 30 MG/ML IJ SOLN: 30 mg/mL | INTRAMUSCULAR | Qty: 1

## 2017-12-24 MED FILL — TYLENOL EXTRA STRENGTH 500 MG PO TABS: 500 mg | ORAL | Qty: 2

## 2017-12-24 NOTE — ED Notes (Signed)
Patient education flyer "[facility] Taking Antibiotics: What you need to know" was provided and reviewed.  Questions answered and understanding was verbalized by the patient and/or family.        Karma Lew, RN  12/24/17 [phone]

## 2017-12-24 NOTE — ED Provider Notes (Signed)
[facility] ED  eMERGENCY dEPARTMENT eNCOUnter      Pt Name: Kara Freeman  MRN: [id number]  Birthdate November 02, 1990  Date of evaluation: 12/24/2017  Provider: Fabian Sharp, MD    CHIEF COMPLAINT       Chief Complaint   Patient presents with   ??? Fever     tylenol @ 01:30   ??? Nasal Congestion     started yesterday   ??? Headache     started yesterday   ??? Chills     started yesterday         HISTORY OF PRESENT ILLNESS  (Location/Symptom, Timing/Onset, Context/Setting, Quality, Duration, Modifying Factors, Severity.)   [name] is a 27 y.o. female who presents to the emergency department for evaluation of flulike symptoms.  Patient has had symptoms for about 24 hours.  She endorses fevers chills and cough.  His muscle aches noted.  She is taken Tylenol.  She continues to have fever no complaint of headache or neck pain.       Nursing Notes were reviewed.    ALLERGIES     Patient has no known allergies.    CURRENT MEDICATIONS       Previous Medications    No medications on file       PAST MEDICAL HISTORY         Diagnosis Date   ??? Herpes    ??? Herpes        SURGICAL HISTORY           Procedure Laterality Date   ??? CESAREAN SECTION           FAMILY HISTORY     History reviewed. No pertinent family history.  No family status information on file.        SOCIAL HISTORY      reports that she has never smoked. She has never used smokeless tobacco. She reports that she does not drink alcohol or use drugs.    REVIEW OF SYSTEMS    (2-9 systems for level 4, 10 or more for level 5)     Review of Systems   All other systems reviewed and are negative.      Except as noted above the remainder of the review of systems was reviewed and negative.     PHYSICAL EXAM    (up to 7 for level 4, 8 or more for level 5)     Vitals:    12/24/17 0312 12/24/17 0341   BP: 128/66    Pulse: 117    Resp: 20    Temp: 99.2 ??F (37.3 ??C) 100.6 ??F (38.1 ??C)   TempSrc: Oral Oral   SpO2: 98%    Weight: 198 lb 14.4 oz (90.2 kg)    Height: 5\' 6"  (1.676 m)         Physical exam reflects a well-nourished female who does appear uncomfortable.  Low-grade temp noted.  She is mildly tachycardic.  Vital signs otherwise stable to include pulse ox 98% on room air.  She's not hypoxic.  She is alert conversive and appropriate in behavior. Rhinorrhea noted.  Oropharyngeal exam without lesion.  TMs shiny bilaterally.  Scattered cervical lymphadenopathy noted.  Neck soft and supple no meningismus.  Trachea midline no stridor.  Heart regular rate and rhythm normal S1-S2 no murmurs rubs gallops.  Lungs are clear to auscultation she does have intermittent cough abdomen is soft throughout.  Extremities show no gross abnormality.  Integument without rash or  lesion.  No neurovascular deficits are noted      DIAGNOSTIC RESULTS         RADIOLOGY:   Non-plain film images such as CT, Ultrasound and MRI are read by the radiologist. Plain radiographic images are visualized and preliminarily interpreted by the emergency physician with the below findings:    XR CHEST STANDARD (2 VW)   Status: Final result   Order Providers     Authorizing Billing   Fabian SharpKaren J Stacey Erwin, MD Shauna HughJames L Weber, MD      ??   Signed by     Signed Date/Time  Phone Pager   Shauna HughWEBER, JAMES L 12/24/2017 03:51 [phone]    Reading Radiologists     Read Date Phone Pager   Shauna HughWEBER, JAMES L Dec 24, 2017 [phone]    Radiation Dose Estimates     No radiation information found for this patient   Narrative   EXAMINATION:   TWO VIEWS OF THE CHEST   ??   12/24/2017 3:45 am   ??   COMPARISON:   01/13/2015   ??   HISTORY:   ORDERING SYSTEM PROVIDED HISTORY: fever cough   TECHNOLOGIST PROVIDED HISTORY:   fever cough   ??   FINDINGS:   Heart size and configuration are normal. ??The lungs are clear. ??No   pneumothorax or pleural fluid. ??No acute bone finding.   ??   ??   Impression   No acute cardiopulmonary disease.         Interpretation per the Radiologist below, if available at the time of this note:    XR CHEST STANDARD (2 VW)   Final Result    No acute cardiopulmonary disease.               LABS:  Labs Reviewed   RAPID INFLUENZA A/B ANTIGENS - Abnormal; Notable for the following:        Result Value    Direct Exam POSITIVE for Influenza A Antigen (*)     All other components within normal limits   CBC WITH AUTO DIFFERENTIAL - Abnormal; Notable for the following:     Seg Neutrophils 75 (*)     Lymphocytes 16 (*)     All other components within normal limits   POCT URINE PREGNANCY - Normal   URINE CULTURE   BASIC METABOLIC PANEL   URINALYSIS     12/24/2017 ??4:00 AM - Edi, [facility] Incoming Lab Results From [facility]     Component Results     Component Collected Lab   Specimen Description 12/24/2017 ??3:25 AM [facility]   .NASOPHARYNGEAL SWAB    Special Requests 12/24/2017 ??3:25 AM [facility]   NOT REPORTED    Direct Exam ??(Abnormal) 12/24/2017 ??3:25 AM [facility]   POSITIVE for Influenza A Antigen     Direct Exam 12/24/2017 ??3:25 AM [facility]      Results for Kara Freeman, Kara Freeman (MRN [id number]) as of 12/24/2017 04:08   Ref. Range 12/24/2017 03:25   Sodium Latest Ref Range: 135 - 144 mmol/L 138   Potassium Latest Ref Range: 3.7 - 5.3 mmol/L 4.0   Chloride Latest Ref Range: 98 - 107 mmol/L 101   CO2 Latest Ref Range: 20 - 31 mmol/L 23   BUN Latest Ref Range: 6 - 20 mg/dL 6   Creatinine Latest Ref Range: 0.50 - 0.90 mg/dL 5.630.69   Bun/Cre Ratio Latest Ref Range: 9 - 20  9   Anion Gap Latest Ref Range: 9 - 17 mmol/L 14   GFR Non-African American Latest Ref Range: >60 mL/min >60   GFR African American Latest Ref Range: >60 mL/min >60   Glucose Latest Ref Range: 70 - 99 mg/dL 97   Calcium Latest Ref Range: 8.6 - 10.4 mg/dL 9.3   GFR Comment Unknown Pend   GFR Staging Unknown NOT REPORTED   WBC Latest Ref Range: 3.5 - 11.0 k/uL 6.0   RBC Latest Ref Range: 4.0 - 5.2 m/uL 4.77   Hemoglobin Quant Latest Ref Range: 12.0 - 16.0 g/dL 16.1   Hematocrit Latest Ref Range: 36 - 46 % 42.2   MCV Latest Ref Range: 80 - 100 fL 88.5   MCH  Latest Ref Range: 26 - 34 pg 28.9   MCHC Latest Ref Range: 31 - 37 g/dL 09.6   MPV Latest Ref Range: 6.0 - 12.0 fL NOT REPORTED   RDW Latest Ref Range: 11.5 - 14.5 % 12.2   Platelet Count Latest Ref Range: 130 - 400 k/uL 264   Platelet Estimate Unknown NOT REPORTED   Absolute Mono # Latest Ref Range: 0.2 - 0.8 k/uL 0.40   Eosinophils % Latest Ref Range: 1 - 4 % 1   Basophils # Latest Ref Range: 0.0 - 0.2 k/uL 0.00   Differential Type Unknown NOT REPORTED   Seg Neutrophils Latest Ref Range: 36 - 66 % 75 (H)   Segs Absolute Latest Ref Range: 1.8 - 7.7 k/uL 4.60   Lymphocytes Latest Ref Range: 24 - 44 % 16 (L)   Absolute Lymph # Latest Ref Range: 1.0 - 4.8 k/uL 1.00   Results for COUNTESS, BIEBEL (MRN [id number]) as of 12/24/2017 04:08   Ref. Range 12/24/2017 03:38   Pregnancy, Urine Unknown negative     All other labs were within normal range or not returned as of this dictation.    EMERGENCY DEPARTMENT COURSE and DIFFERENTIAL DIAGNOSIS/MDM:   Vitals:    Vitals:    12/24/17 0312 12/24/17 0341   BP: 128/66    Pulse: 117    Resp: 20    Temp: 99.2 ??F (37.3 ??C) 100.6 ??F (38.1 ??C)   TempSrc: Oral Oral   SpO2: 98%    Weight: 198 lb 14.4 oz (90.2 kg)    Height: 5\' 6"  (1.676 m)      Patient is evaluated.  She is treated with IV fluids and symptomatic medications.  Investigations reflect influenza A.  She is placed on Tamiflu.  She'll be discharged home on continued symptomatic management.    CONSULTS:  None    PROCEDURES:  None    FINAL IMPRESSION      1. Influenza A          DISPOSITION/PLAN   DISPOSITION Decision To Discharge 12/24/2017 04:12:06 AM      PATIENT REFERRED TO:   Vevelyn Pat, MD  [address]  [address]  [place] [address]  [phone]    Schedule an appointment as soon as possible for a visit   If symptoms worsen    [facility]  [address] [place] [address]  [phone]    As needed, If symptoms worsen      DISCHARGE MEDICATIONS:     New Prescriptions    HYDROCODONE-ACETAMINOPHEN (NORCO) 5-325 MG PER  TABLET    Take 1 tablet by mouth every 6 hours as needed (as needed for discomfort, cough, fever) for up  to 5 days..    IBUPROFEN (ADVIL;MOTRIN) 800 MG TABLET    Take 1 tablet by mouth every 8 hours as needed for Pain or Fever    ONDANSETRON (ZOFRAN ODT) 4 MG DISINTEGRATING TABLET    Take 1 tablet by mouth every 4 hours as needed for Nausea or Vomiting    OSELTAMIVIR (TAMIFLU) 75 MG CAPSULE    Take 1 capsule by mouth 2 times daily for 5 days           (Please note that portions of this note were completed with a voice recognition program.  Efforts were made to edit the dictations but occasionally words are mis-transcribed.)    Fabian Sharp, MD  Attending Emergency Physician         Fabian Sharp, MD  12/24/17 [phone]

## 2017-12-24 NOTE — ED Notes (Signed)
Pt arrived to ED with c/o Fever, Body Aches, Nasal Congestion and chills that started yesterday. Pt denies nausea, vomiting, diarrhea. Pt did not get Flu Shot this year. Pt denies chest pain. Pt denies shortness of breath. Pt states she took one dose of Tylenol at approximately 0130. Pt talking in full sentences without complication. Respirations non labored. Skin warm and dry. Skin color appropriate to race. Pt A&Ox4.      Khila Papp P WilcElaina Patteeox, RN  12/24/17 [phone]

## 2017-12-25 LAB — CULTURE, URINE: Culture: NO GROWTH

## 2017-12-27 LAB — OB RESULTS CONSOLE GBS: GBS: POSITIVE

## 2018-01-24 ENCOUNTER — Encounter (HOSPITAL_COMMUNITY): Payer: Self-pay | Admitting: *Deleted

## 2018-01-24 ENCOUNTER — Inpatient Hospital Stay (HOSPITAL_COMMUNITY)
Admission: AD | Admit: 2018-01-24 | Payer: PRIVATE HEALTH INSURANCE | Source: Ambulatory Visit | Admitting: Obstetrics and Gynecology

## 2018-01-24 ENCOUNTER — Telehealth (HOSPITAL_COMMUNITY): Payer: Self-pay | Admitting: *Deleted

## 2018-01-24 NOTE — Telephone Encounter (Signed)
Preadmission screen  

## 2018-01-25 NOTE — H&P (Signed)
Titania Gault is a 27 y.o. female G1P0 at 100+ for IOL given term status, postdates and favorable cervix.  Pregnancy complicated by history of cardiomyopathy that was largely normal. Also maternal obesity and hepatic artery stenosis.  Received [facility] and [facility] in [facility].  Normal genetic screening.  Pregnancy dated by early [place].  Also complicated by maternal obesity  OB History    Gravida  1   Para      Term      Preterm      AB      Living  0     SAB      TAB      Ectopic      Multiple      Live Births            G1 present No abn pap No STD  Past Medical History:  Diagnosis Date  . Cardiomyopathy (HCC)    monitored by [facility] pt bringing records  . Fatty liver   . Hepatic artery stenosis (HCC)   gallstones  Past Surgical History:  Procedure Laterality Date  . NO PAST SURGERIES     Family History: family history includes Cervical cancer in her paternal aunt; Diabetes in her paternal grandmother; Hypertension in her father. Social History:  reports that she has never smoked. She has never used smokeless tobacco. She reports that she does not drink alcohol or use drugs.[facility].  Married  Meds PNV, Vit D, mag oxide All Latex     Maternal Diabetes: No Genetic Screening: Normal Maternal Ultrasounds/Referrals: Normal Fetal Ultrasounds or other Referrals:  None Maternal Substance Abuse:  No Significant Maternal Medications:  None Significant Maternal Lab Results:  Lab values include: Group B Strep positive Other Comments:  maternal echo largely normal  Review of Systems  Constitutional: Negative.   HENT: Negative.   Eyes: Negative.   Respiratory: Negative.   Cardiovascular: Negative.   Gastrointestinal: Negative.   Genitourinary: Negative.   Musculoskeletal: Positive for back pain.  Skin: Negative.   Neurological: Negative.   Psychiatric/Behavioral: Negative.    Maternal Medical History:  Contractions: Frequency: irregular.    Perceived severity is moderate.    Fetal activity: Perceived fetal activity is normal.    Prenatal Complications - Diabetes: none.      Last menstrual period 04/09/2017. Maternal Exam:  Abdomen: Patient reports no abdominal tenderness. Fundal height is appropriate for gestation.   Estimated fetal weight is 7.5-8.5#.   Fetal presentation: vertex  Introitus: Normal vulva. Normal vagina.    Physical Exam  Constitutional: She is oriented to person, place, and time. She appears well-developed and well-nourished.  HENT:  Head: Normocephalic and atraumatic.  Cardiovascular: Normal rate and regular rhythm.  Respiratory: Effort normal and breath sounds normal. No respiratory distress. She has no wheezes.  GI: Soft. Bowel sounds are normal. She exhibits no distension. There is no tenderness.  Musculoskeletal: Normal range of motion.  Neurological: She is alert and oriented to person, place, and time.  Skin: Skin is warm and dry.  Psychiatric: She has a normal mood and affect. Her behavior is normal.    Prenatal labs: ABO, Rh: O/Positive/-- (08/13 0000) Antibody: Negative (08/13 0000) Rubella: Immune (08/13 0000) RPR: Nonreactive (08/13 0000)  HBsAg: Negative (08/13 0000)  HIV: Non-reactive (08/13 0000)  GBS: Positive (03/01 0000)  dated by 7 wk [place] First trimester scr WNL, Nl NT Anat limited, f/u wnl with MFM - nl anat, ant plac, gender reveal Growth  US WNL  [facility] work[facility] panel negative/Hgb 12.6/Plt 261/Ur Cx neg/GC neg/Chl neg/Variccella immune/glucola 179 - nl 3hr GTT  Maternal echo Assessment/Plan: 27yoG1 at 40+ for IOL H/o cardiomyopathy maternal echo in pregnancy Epidural for pain/stadol for pain IOL AROM (After PCN for GBBS+) and pitocin Expect SVD  Hospital circ paid  Faithann Natal Bovard-Stuckert 01/25/2018, 10:59 PM

## 2018-01-26 ENCOUNTER — Inpatient Hospital Stay (HOSPITAL_COMMUNITY): Payer: PRIVATE HEALTH INSURANCE | Admitting: Anesthesiology

## 2018-01-26 ENCOUNTER — Encounter (HOSPITAL_COMMUNITY): Payer: Self-pay

## 2018-01-26 ENCOUNTER — Inpatient Hospital Stay (HOSPITAL_COMMUNITY)
Admission: RE | Admit: 2018-01-26 | Discharge: 2018-01-28 | DRG: 806 | Disposition: A | Discharge status: Home / Self Care | Payer: PRIVATE HEALTH INSURANCE | Source: Ambulatory Visit | Attending: Obstetrics and Gynecology | Admitting: Obstetrics and Gynecology

## 2018-01-26 DIAGNOSIS — O99419 Diseases of the circulatory system complicating pregnancy, unspecified trimester: Secondary | ICD-10-CM | POA: Diagnosis present

## 2018-01-26 DIAGNOSIS — I708 Atherosclerosis of other arteries: Secondary | ICD-10-CM | POA: Diagnosis present

## 2018-01-26 DIAGNOSIS — O99214 Obesity complicating childbirth: Secondary | ICD-10-CM | POA: Diagnosis present

## 2018-01-26 DIAGNOSIS — Z3A40 40 weeks gestation of pregnancy: Secondary | ICD-10-CM | POA: Diagnosis not present

## 2018-01-26 DIAGNOSIS — I429 Cardiomyopathy, unspecified: Secondary | ICD-10-CM | POA: Diagnosis present

## 2018-01-26 DIAGNOSIS — O480 Post-term pregnancy: Secondary | ICD-10-CM | POA: Diagnosis present

## 2018-01-26 DIAGNOSIS — O99824 Streptococcus B carrier state complicating childbirth: Secondary | ICD-10-CM | POA: Diagnosis present

## 2018-01-26 LAB — COMPREHENSIVE METABOLIC PANEL
ALBUMIN: 3.1 g/dL — AB (ref 3.5–5.0)
ALT: 28 U/L (ref 14–54)
ANION GAP: 10 (ref 5–15)
AST: 23 U/L (ref 15–41)
Alkaline Phosphatase: 189 U/L — ABNORMAL HIGH (ref 38–126)
BUN: 5 mg/dL — AB (ref 6–20)
CHLORIDE: 105 mmol/L (ref 101–111)
CO2: 19 mmol/L — ABNORMAL LOW (ref 22–32)
Calcium: 8.4 mg/dL — ABNORMAL LOW (ref 8.9–10.3)
Creatinine, Ser: 0.42 mg/dL — ABNORMAL LOW (ref 0.44–1.00)
GFR calc Af Amer: >60 mL/min (ref >60)
GFR calc non Af Amer: >60 mL/min (ref >60)
GLUCOSE: 79 mg/dL (ref 65–99)
Potassium: 3.7 mmol/L (ref 3.5–5.1)
Sodium: 134 mmol/L — ABNORMAL LOW (ref 135–145)
TOTAL PROTEIN: 6.5 g/dL (ref 6.5–8.1)
Total Bilirubin: 0.9 mg/dL (ref 0.3–1.2)

## 2018-01-26 LAB — CBC
HEMATOCRIT: 39.7 % (ref 36.0–46.0)
Hemoglobin: 14.2 g/dL (ref 12.0–15.0)
MCH: 30.6 pg (ref 26.0–34.0)
MCHC: 35.8 g/dL (ref 30.0–36.0)
MCV: 85.6 fL (ref 78.0–100.0)
Platelets: 150 10*3/uL (ref 150–400)
RBC: 4.64 MIL/uL (ref 3.87–5.11)
RDW: 13.7 % (ref 11.5–15.5)
WBC: 10.5 10*3/uL (ref 4.0–10.5)

## 2018-01-26 LAB — TYPE AND SCREEN
ABO/RH(D): O POS
Antibody Screen: NEGATIVE

## 2018-01-26 LAB — ABO/RH: ABO/RH(D): O POS

## 2018-01-26 MED ORDER — SOD CITRATE-CITRIC ACID 500-334 MG/5ML PO SOLN
30.0000 mL | ORAL | Status: DC | PRN
  Administered 2018-01-26: 30 mL via ORAL
  Filled 2018-01-26: qty 15

## 2018-01-26 MED ORDER — PHENYLEPHRINE 40 MCG/ML (10ML) SYRINGE FOR IV PUSH (FOR BLOOD PRESSURE SUPPORT)
80.0000 ug | PREFILLED_SYRINGE | INTRAVENOUS | Status: DC | PRN
  Filled 2018-01-26: qty 5

## 2018-01-26 MED ORDER — LACTATED RINGERS IV SOLN
INTRAVENOUS | Status: DC
  Administered 2018-01-26 (×2): via INTRAVENOUS

## 2018-01-26 MED ORDER — PRENATAL MULTIVITAMIN CH
1.0000 | ORAL_TABLET | Freq: Every day | ORAL | Status: DC
  Administered 2018-01-27 – 2018-01-28 (×2): 1 via ORAL
  Filled 2018-01-26 (×2): qty 1

## 2018-01-26 MED ORDER — BUTORPHANOL TARTRATE 1 MG/ML IJ SOLN: 1.0000 mg | INTRAMUSCULAR | Status: DC | PRN

## 2018-01-26 MED ORDER — OXYCODONE HCL 5 MG PO TABS: 10.0000 mg | ORAL_TABLET | ORAL | Status: DC | PRN

## 2018-01-26 MED ORDER — OXYCODONE HCL 5 MG PO TABS: 5.0000 mg | ORAL_TABLET | ORAL | Status: DC | PRN

## 2018-01-26 MED ORDER — SIMETHICONE 80 MG PO CHEW: 80.0000 mg | CHEWABLE_TABLET | ORAL | Status: DC | PRN

## 2018-01-26 MED ORDER — FENTANYL 2.5 MCG/ML BUPIVACAINE 1/10 % EPIDURAL INFUSION (WH - ANES)
14.0000 mL/h | INTRAMUSCULAR | Status: DC | PRN
  Administered 2018-01-26: 14 mL/h via EPIDURAL
  Filled 2018-01-26: qty 100

## 2018-01-26 MED ORDER — ONDANSETRON HCL 4 MG/2ML IJ SOLN
4.0000 mg | Freq: Four times a day (QID) | INTRAMUSCULAR | Status: DC | PRN
  Administered 2018-01-26: 4 mg via INTRAVENOUS
  Filled 2018-01-26: qty 2

## 2018-01-26 MED ORDER — FAMOTIDINE 20 MG PO TABS
20.0000 mg | ORAL_TABLET | Freq: Every day | ORAL | Status: DC
  Administered 2018-01-26: 20 mg via ORAL
  Filled 2018-01-26: qty 1

## 2018-01-26 MED ORDER — DIBUCAINE 1 % RE OINT: 1.0000 "application " | TOPICAL_OINTMENT | RECTAL | Status: DC | PRN

## 2018-01-26 MED ORDER — BENZOCAINE-MENTHOL 20-0.5 % EX AERO: 1.0000 "application " | INHALATION_SPRAY | CUTANEOUS | Status: DC | PRN

## 2018-01-26 MED ORDER — OXYTOCIN 40 UNITS IN LACTATED RINGERS INFUSION - SIMPLE MED
1.0000 m[IU]/min | INTRAVENOUS | Status: DC
  Administered 2018-01-26: 2 m[IU]/min via INTRAVENOUS

## 2018-01-26 MED ORDER — DIPHENHYDRAMINE HCL 25 MG PO CAPS: 25.0000 mg | ORAL_CAPSULE | Freq: Four times a day (QID) | ORAL | Status: DC | PRN

## 2018-01-26 MED ORDER — ONDANSETRON HCL 4 MG PO TABS: 4.0000 mg | ORAL_TABLET | ORAL | Status: DC | PRN

## 2018-01-26 MED ORDER — ACETAMINOPHEN 325 MG PO TABS
650.0000 mg | ORAL_TABLET | ORAL | Status: DC | PRN
  Administered 2018-01-27: 650 mg via ORAL
  Filled 2018-01-26: qty 2

## 2018-01-26 MED ORDER — CALCIUM CARBONATE ANTACID 500 MG PO CHEW
1.0000 | CHEWABLE_TABLET | Freq: Every day | ORAL | Status: DC
  Administered 2018-01-26: 200 mg via ORAL
  Filled 2018-01-26: qty 1

## 2018-01-26 MED ORDER — ONDANSETRON HCL 4 MG/2ML IJ SOLN
4.0000 mg | INTRAMUSCULAR | Status: DC | PRN
  Administered 2018-01-27: 4 mg via INTRAVENOUS
  Filled 2018-01-26: qty 2

## 2018-01-26 MED ORDER — SENNOSIDES-DOCUSATE SODIUM 8.6-50 MG PO TABS
2.0000 | ORAL_TABLET | ORAL | Status: DC
  Administered 2018-01-27: 2 via ORAL
  Filled 2018-01-26: qty 2

## 2018-01-26 MED ORDER — PENICILLIN G POT IN DEXTROSE 60000 UNIT/ML IV SOLN
3.0000 10*6.[IU] | INTRAVENOUS | Status: DC
  Administered 2018-01-26 (×2): 3 10*6.[IU] via INTRAVENOUS
  Filled 2018-01-26 (×4): qty 50

## 2018-01-26 MED ORDER — IBUPROFEN 600 MG PO TABS
600.0000 mg | ORAL_TABLET | Freq: Four times a day (QID) | ORAL | Status: DC
  Administered 2018-01-26 – 2018-01-28 (×7): 600 mg via ORAL
  Filled 2018-01-26 (×8): qty 1

## 2018-01-26 MED ORDER — EPHEDRINE 5 MG/ML INJ
10.0000 mg | INTRAVENOUS | Status: DC | PRN
  Filled 2018-01-26: qty 2

## 2018-01-26 MED ORDER — OXYCODONE-ACETAMINOPHEN 5-325 MG PO TABS: 1.0000 | ORAL_TABLET | ORAL | Status: DC | PRN

## 2018-01-26 MED ORDER — OXYCODONE-ACETAMINOPHEN 5-325 MG PO TABS: 2.0000 | ORAL_TABLET | ORAL | Status: DC | PRN

## 2018-01-26 MED ORDER — LIDOCAINE HCL (PF) 1 % IJ SOLN
INTRAMUSCULAR | Status: DC | PRN
  Administered 2018-01-26 (×2): 5 mL via EPIDURAL

## 2018-01-26 MED ORDER — WITCH HAZEL-GLYCERIN EX PADS: 1.0000 "application " | MEDICATED_PAD | CUTANEOUS | Status: DC | PRN

## 2018-01-26 MED ORDER — LIDOCAINE HCL (PF) 1 % IJ SOLN
30.0000 mL | INTRAMUSCULAR | Status: DC | PRN
  Filled 2018-01-26: qty 30

## 2018-01-26 MED ORDER — DIPHENHYDRAMINE HCL 50 MG/ML IJ SOLN: 12.5000 mg | INTRAMUSCULAR | Status: DC | PRN

## 2018-01-26 MED ORDER — ACETAMINOPHEN 325 MG PO TABS: 650.0000 mg | ORAL_TABLET | ORAL | Status: DC | PRN

## 2018-01-26 MED ORDER — COCONUT OIL OIL
1.0000 "application " | TOPICAL_OIL | Status: DC | PRN
  Filled 2018-01-26: qty 120

## 2018-01-26 MED ORDER — PENICILLIN G POTASSIUM 5000000 UNITS IJ SOLR
5.0000 10*6.[IU] | Freq: Once | INTRAMUSCULAR | Status: AC
  Administered 2018-01-26: 5 10*6.[IU] via INTRAVENOUS
  Filled 2018-01-26: qty 5

## 2018-01-26 MED ORDER — ZOLPIDEM TARTRATE 5 MG PO TABS: 5.0000 mg | ORAL_TABLET | Freq: Every evening | ORAL | Status: DC | PRN

## 2018-01-26 MED ORDER — LACTATED RINGERS IV SOLN: 500.0000 mL | Freq: Once | INTRAVENOUS | Status: DC

## 2018-01-26 MED ORDER — OXYTOCIN BOLUS FROM INFUSION: 500.0000 mL | Freq: Once | INTRAVENOUS | Status: DC

## 2018-01-26 MED ORDER — TERBUTALINE SULFATE 1 MG/ML IJ SOLN: 0.2500 mg | Freq: Once | INTRAMUSCULAR | Status: DC | PRN

## 2018-01-26 MED ORDER — LACTATED RINGERS IV SOLN: 500.0000 mL | INTRAVENOUS | Status: DC | PRN

## 2018-01-26 MED ORDER — OXYTOCIN 40 UNITS IN LACTATED RINGERS INFUSION - SIMPLE MED
2.5000 [IU]/h | INTRAVENOUS | Status: DC
  Filled 2018-01-26: qty 1000

## 2018-01-26 MED ORDER — PHENYLEPHRINE 40 MCG/ML (10ML) SYRINGE FOR IV PUSH (FOR BLOOD PRESSURE SUPPORT)
80.0000 ug | PREFILLED_SYRINGE | INTRAVENOUS | Status: DC | PRN
  Filled 2018-01-26: qty 10
  Filled 2018-01-26: qty 5

## 2018-01-26 MED ORDER — TERBUTALINE SULFATE 1 MG/ML IJ SOLN
0.2500 mg | Freq: Once | INTRAMUSCULAR | Status: DC | PRN
  Filled 2018-01-26: qty 1

## 2018-01-26 NOTE — Progress Notes (Signed)
Patient ID: Yvette Hart, female   DOB: 03/20/1991, 27 y.o.   MRN: [id number]   Pt comfortable with epidural  AFVSS gen NAD FHTs 130's, mod var, +accels, category 1 toco q2-1023min  SVE 6/90/0, vertex, mid  AROM for small clear fluid  Continue IOL

## 2018-01-26 NOTE — Anesthesia Procedure Notes (Signed)
Epidural Patient location during procedure: OB Start time: 01/26/2018 12:25 PM End time: 01/26/2018 12:32 PM  Staffing Anesthesiologist: Bethena Midgetddono, Kemara Quigley, MD  Preanesthetic Checklist Completed: patient identified, site marked, surgical consent, pre-op evaluation, timeout performed, IV checked, risks and benefits discussed and monitors and equipment checked  Epidural Patient position: sitting Prep: site prepped and draped and DuraPrep Patient monitoring: continuous pulse ox and blood pressure Approach: midline Location: L4-L5 Injection technique: LOR air  Needle:  Needle type: Tuohy  Needle gauge: 17 G Needle length: 9 cm and 9 Needle insertion depth: 7 cm Catheter type: closed end flexible Catheter size: 19 Gauge Catheter at skin depth: 12 cm Test dose: negative  Assessment Events: blood not aspirated, injection not painful, no injection resistance, negative IV test and no paresthesia

## 2018-01-26 NOTE — Progress Notes (Signed)
Patient ID: Yvette Hart, female   DOB: 06/06/1991, 27 y.o.   MRN: [id number]   H&P reviewed, no changes  AFVSS gen NAD FHTs 140's mod var, + accels, category 1 toco q 2-4 min, irr  AROM 4hr after PCN Pitocin to start induction Epidural prn Expect SVD

## 2018-01-26 NOTE — Anesthesia Postprocedure Evaluation (Signed)
Anesthesia Post Note  Patient: Yvette Hart  Procedure(s) Performed: AN AD HOC LABOR EPIDURAL     Patient location during evaluation: Mother Baby Anesthesia Type: Epidural Level of consciousness: awake and alert Pain management: pain level controlled Vital Signs Assessment: post-procedure vital signs reviewed and stable Respiratory status: spontaneous breathing, nonlabored ventilation and respiratory function stable Cardiovascular status: stable Postop Assessment: no headache, no backache and epidural receding Anesthetic complications: no    Last Vitals:  Vitals:   01/26/18 2049 01/26/18 2146  BP: 134/79 125/66  Pulse: 74 74  Resp: 18 18  Temp: 37.6 C 37.2 C  SpO2: 98% 99%    Last Pain:  Vitals:   01/26/18 2146  TempSrc: Oral  PainSc: 0-No pain   Pain Goal:                 [name]

## 2018-01-26 NOTE — Anesthesia Pain Management Evaluation Note (Signed)
  CRNA Pain Management Visit Note  Patient: Yvette Hart, 27 y.o., female  "Hello I am a member of the anesthesia team at [facility]. We have an anesthesia team available at all times to provide care throughout the hospital, including epidural management and anesthesia for C-section. I don't know your plan for the delivery whether it a natural birth, water birth, IV sedation, nitrous supplementation, doula or epidural, but we want to meet your pain goals."   1.Was your pain managed to your expectations on prior hospitalizations?   No prior hospitalizations  2.What is your expectation for pain management during this hospitalization?     Epidural  3.How can we help you reach that goal? Support prn  Record the patient's initial score and the patient's pain goal.   Pain: 0  Pain Goal: 5 [facility] wants you to be able to say your pain was always managed very well.  [facility] LLC[facility]INKLE,Ilyssa Grennan 01/26/2018

## 2018-01-26 NOTE — Anesthesia Preprocedure Evaluation (Signed)
Anesthesia Evaluation  Patient identified by MRN, date of birth, ID band Patient awake    Reviewed: Allergy & Precautions, H&P , NPO status , Patient's Chart, lab work & pertinent test results, reviewed documented beta blocker date and time   Airway Mallampati: III  TM Distance: >3 FB Neck ROM: full    Dental no notable dental hx. (+) Teeth Intact   Pulmonary neg pulmonary ROS,    Pulmonary exam normal breath sounds clear to auscultation       Cardiovascular negative cardio ROS Normal cardiovascular exam Rhythm:regular Rate:Normal     Neuro/Psych negative neurological ROS  negative psych ROS   GI/Hepatic negative GI ROS, Neg liver ROS,   Endo/Other  negative endocrine ROSMorbid obesity  Renal/GU negative Renal ROS  negative genitourinary   Musculoskeletal   Abdominal   Peds  Hematology negative hematology ROS (+)   Anesthesia Other Findings   Reproductive/Obstetrics (+) Pregnancy                             Anesthesia Physical Anesthesia Plan  ASA: III  Anesthesia Plan: Epidural   Post-op Pain Management:    Induction:   PONV Risk Score and Plan:   Airway Management Planned:   Additional Equipment:   Intra-op Plan:   Post-operative Plan:   Informed Consent: I have reviewed the patients History and Physical, chart, labs and discussed the procedure including the risks, benefits and alternatives for the proposed anesthesia with the patient or authorized representative who has indicated his/her understanding and acceptance.     Plan Discussed with:   Anesthesia Plan Comments:         Anesthesia Quick Evaluation

## 2018-01-27 LAB — CBC
HEMATOCRIT: 32.4 % — AB (ref 36.0–46.0)
HEMOGLOBIN: 11.5 g/dL — AB (ref 12.0–15.0)
MCH: 30.5 pg (ref 26.0–34.0)
MCHC: 35.5 g/dL (ref 30.0–36.0)
MCV: 85.9 fL (ref 78.0–100.0)
Platelets: 149 10*3/uL — ABNORMAL LOW (ref 150–400)
RBC: 3.77 MIL/uL — AB (ref 3.87–5.11)
RDW: 13.7 % (ref 11.5–15.5)
WBC: 11 10*3/uL — ABNORMAL HIGH (ref 4.0–10.5)

## 2018-01-27 LAB — RPR: RPR: NONREACTIVE

## 2018-01-27 NOTE — Addendum Note (Signed)
Addendum  created 01/27/18 0904 by Shanon PayorGregory, Monifah Freehling M, CRNA   Charge Capture section accepted, Sign clinical note, Visit diagnoses modified

## 2018-01-27 NOTE — Lactation Note (Addendum)
This note was copied from a baby's chart. Lactation Consultation Note  Patient Name: Yvette Hart ZOXWR'UToday's Date: 01/27/2018 Reason for consult: Follow-up assessment;1st time breastfeeding;Primapara;Difficult latch   G1P1 mother whose infant is now 4727 hours old.  Mother is concerned that infant is not "getting enough".  LC talked with mother about breastfeeding at this age of 29 hours.  Discussed doing a lot of STS, breast massage, pre-pumping prior to latching and to call for assistance as needed.  Mother's nipples do evert but are short.  There is mild areolar edema noted and LC wanted to get shells for mother's use.  However, the unit has no shells in stock at this time.  LC obtained a manual pump and demonstrated the use for mother.  Flanges changed from a size #24 to a #27.  Mother comfortable.  Reviewed cleaning of pump parts.  Infant awake and LC offered to assist with latch.  Mother prefers the football hold so assisted with a latch on the left breast.  Infant latched but did not stay on breast.  Used nipple shield and infant latched again after repeated attempts and sucked a couple of times only.  Mother stated she wants to use the shield.  Infant very sleepy so infant was held STS while mother finished manually pumping.  Colostrum noted with pumping and mother pleased. Provided a spoon and instructed mother to spoon feed any colostrum she obtained.  Encouraged her to continue this routine of massage, pre-pumping and latching throughout the night and to call her RN for help if needed.  Mother satisfied and will call for help.  RN had given mother a nipple shield at last feeding but mother stated that baby was sleepy.    Maternal Data Has patient been taught Hand Expression?: Yes Does the patient have breastfeeding experience prior to this delivery?: No  Feeding Feeding Type: Breast Fed Length of feed: 5 min  LATCH Score Latch: Repeated attempts needed to sustain latch, nipple held in  mouth throughout feeding, stimulation needed to elicit sucking reflex.  Audible Swallowing: None  Type of Nipple: Everted at rest and after stimulation  Comfort (Breast/Nipple): Soft / non-tender  Hold (Positioning): Assistance needed to correctly position infant at breast and maintain latch.  LATCH Score: 6  Interventions Interventions: Breast feeding basics reviewed;Assisted with latch;Skin to skin;Breast massage;Hand express;Position options;Support pillows;Adjust position;Breast compression;Hand pump;DEBP  Lactation Tools Discussed/Used Tools: Pump;Flanges;Nipple Shields Nipple shield size: 24 Flange Size: 27 Breast pump type: Double-Electric Breast Pump;Manual Pump Review: Setup, frequency, and cleaning   Consult Status Consult Status: Follow-up Date: 01/27/18 Follow-up type: In-patient    Drayven Marchena R Cleveland Yarbro 01/27/2018, 11:25 PM

## 2018-01-27 NOTE — Progress Notes (Signed)
Post Partum Day 1 Subjective: up ad lib, voiding and tolerating PO.  Working on breastfeeding and baby not latching well.  Objective: Blood pressure 116/69, pulse 77, temperature 98.4 F (36.9 C), temperature source Oral, resp. rate 18, height 5\' 5"  (1.651 m), weight 254 lb 6.4 oz (115.4 kg), last menstrual period 04/09/2017, SpO2 97 %, unknown if currently breastfeeding.  Physical Exam:  General: alert and cooperative Lochia: appropriate Uterine Fundus: firm   Recent Labs    01/26/18 0830 01/27/18 0530  HGB 14.2 11.5*  HCT 39.7 32.4*    Assessment/Plan: Plan for discharge tomorrow  Baby has not yet voided and not feeding well, some concern over shorter foreskin, but normal variant and cleared for circumcision when eating better.    LOS: 1 day   Oliver PilaKathy W Shaquel Chavous 01/27/2018, 9:26 AM

## 2018-01-27 NOTE — Lactation Note (Signed)
This note was copied from a baby's chart. Lactation Consultation Note  Patient Name: Boy Lyndal Rainbowshley Derick ZOXWR'UToday's Date: 01/27/2018 Reason for consult: Initial assessment;Primapara;Term Breastfeeding consultation services and support information given to patient.  Baby is 18 hours old and sleepy at breast.  Undressed baby and did waking techniques.  Mom can easily hand express colostrum.  Baby latched easily and fed off and on for 10 minutes.  Instructed mom on breast massage during feeding.  Basics reviewed and questions answered.  Encouraged to call for assist prn.  Maternal Data Has patient been taught Hand Expression?: Yes Does the patient have breastfeeding experience prior to this delivery?: No  Feeding Feeding Type: Breast Fed Length of feed: 5 min  LATCH Score Latch: Grasps breast easily, tongue down, lips flanged, rhythmical sucking.  Audible Swallowing: A few with stimulation  Type of Nipple: Everted at rest and after stimulation  Comfort (Breast/Nipple): Soft / non-tender  Hold (Positioning): Assistance needed to correctly position infant at breast and maintain latch.  LATCH Score: 8  Interventions Interventions: Breast feeding basics reviewed;Assisted with latch;Breast compression;Skin to skin;Adjust position;Breast massage;Support pillows;Hand express;Position options  Lactation Tools Discussed/Used     Consult Status Consult Status: Follow-up Date: 01/28/18 Follow-up type: In-patient    Huston FoleyMOULDEN, Quint Chestnut S 01/27/2018, 1:50 PM

## 2018-01-27 NOTE — Anesthesia Postprocedure Evaluation (Signed)
Anesthesia Post Note  Patient: Yvette Hart  Procedure(s) Performed: AN AD HOC LABOR EPIDURAL     Patient location during evaluation: Mother Baby Anesthesia Type: Epidural Level of consciousness: awake and alert and oriented Pain management: satisfactory to patient Vital Signs Assessment: post-procedure vital signs reviewed and stable Respiratory status: spontaneous breathing and nonlabored ventilation Cardiovascular status: stable Postop Assessment: no headache, no backache, no signs of nausea or vomiting, adequate PO intake and patient able to bend at knees (patient up walking) Anesthetic complications: no    Last Vitals:  Vitals:   01/26/18 2146 01/27/18 0157  BP: 125/66 116/69  Pulse: 74 77  Resp: 18 18  Temp: 37.2 C 36.9 C  SpO2: 99% 97%    Last Pain:  Vitals:   01/27/18 0649  TempSrc:   PainSc: 0-No pain   Pain Goal:                 Madison HickmanGREGORY,[name]

## 2018-01-28 ENCOUNTER — Other Ambulatory Visit: Payer: Self-pay

## 2018-01-28 MED ORDER — PRENATAL MULTIVITAMIN CH: 1.0000 | ORAL_TABLET | Freq: Every day | ORAL | 12 refills | Status: DC

## 2018-01-28 MED ORDER — IBUPROFEN 600 MG PO TABS: 600.0000 mg | ORAL_TABLET | Freq: Four times a day (QID) | ORAL | 0 refills | Status: DC

## 2018-01-28 NOTE — Lactation Note (Addendum)
This note was copied from a baby's chart. Lactation Consultation Note  Patient Name: Boy Lyndal Rainbowshley Kittelson WUJWJ'XToday's Date: 01/28/2018 Reason for consult: Follow-up assessment   P1, Baby 38 hours old.  Crack on base of L nipple. Mother was able to latch baby well with depth without use of nipple shield. Provided mother with shells.   Observed latch in football and repositioned to cross cradle and provided pillows for support. Baby sleepy at the breast.  Suggest compressing breast during feeding to keep baby active. Mom encouraged to feed baby 8-12 times/24 hours and with feeding cues.  Reviewed engorgement care and monitoring voids/stools. Mother is using coconut oil for soreness and will wear shells. Mother has manual pump and plans to continue to post pump and give volume back to baby.    Maternal Data    Feeding Feeding Type: Breast Fed Length of feed: 20 min  LATCH Score Latch: Grasps breast easily, tongue down, lips flanged, rhythmical sucking.  Audible Swallowing: A few with stimulation  Type of Nipple: Everted at rest and after stimulation  Comfort (Breast/Nipple): Filling, red/small blisters or bruises, mild/mod discomfort  Hold (Positioning): No assistance needed to correctly position infant at breast.  LATCH Score: 8  Interventions Interventions: Breast feeding basics reviewed;Breast compression;Expressed milk  Lactation Tools Discussed/Used Tools: Pump   Consult Status Consult Status: Complete    Hardie PulleyBerkelhammer, Ruth Boschen 01/28/2018, 9:18 AM

## 2018-01-28 NOTE — Progress Notes (Signed)
Post Partum Day 2 Subjective: no complaints, up ad lib, voiding, tolerating PO, + flatus and no BM yet. Bonding well with baby- breastfeeding. Denies CP, SOB or HA. Ready for discharge to home today  Objective: Blood pressure 117/75, pulse 74, temperature 98 F (36.7 C), temperature source Oral, resp. rate 18, height 5\' 5"  (1.651 m), weight 254 lb 6.4 oz (115.4 kg), last menstrual period 04/09/2017, SpO2 97 %, unknown if currently breastfeeding.  Physical Exam:  General: alert, cooperative and no distress Lochia: appropriate Uterine Fundus: firm Incision: n/a DVT Evaluation: No evidence of DVT seen on physical exam. No significant calf/ankle edema.  Recent Labs    01/26/18 0830 01/27/18 0530  HGB 14.2 11.5*  HCT 39.7 32.4*    Assessment/Plan: Discharge home and Breastfeeding  Per peds defer circumcision to urologist Reviewed pp instructions; f/u in 6 weeks   LOS: 2 days   Yvette Hart 01/28/2018, 9:31 AM

## 2018-01-28 NOTE — Discharge Instructions (Signed)
Nothing in vagina for 6 weeks.  No sex, tampons, and douching.  Other instructions as in [facility] Discharge Booklet. °

## 2018-01-30 NOTE — Discharge Summary (Signed)
OB Discharge Summary     Patient Name: Yvette Hart DOB: 01/21/1991 MRN: [id number]  Date of admission: 01/26/2018 Delivering MD: Sherian ReinBOVARD-STUCKERT, JODY   Date of discharge: 01/30/2018  Admitting diagnosis: 40 wk induction Intrauterine pregnancy: 3181w2d     Secondary diagnosis:  Principal Problem:   SVD (spontaneous vaginal delivery) Active Problems:   Indication for care in labor or delivery  Additional problems: none     Discharge diagnosis: Term Pregnancy Delivered                                                                                                Post partum procedures:none  Augmentation: AROM and Pitocin  Complications: None  Hospital course:  Induction of Labor With Vaginal Delivery   27 y.o. yo G1P1001 at 7681w2d was admitted to the hospital 01/26/2018 for induction of labor.  Indication for induction: Favorable cervix at term.  Patient had an uncomplicated labor course as follows: Membrane Rupture Time/Date: 1:24 PM ,01/26/2018   Intrapartum Procedures: Episiotomy: None [1]                                         Lacerations:  Perineal [11];Periurethral [8]  Patient had delivery of a Viable infant.  Information for the patient's newborn:  Helane RimaLewis, Gunner Scott [[id number]][[id number]]  Delivery Method: Vaginal, Spontaneous(Filed from Delivery Summary)   01/26/2018  Details of delivery can be found in separate delivery note.  Patient had a routine postpartum course. Patient is discharged home 01/30/18.  Physical exam  Vitals:   01/27/18 0157 01/27/18 1005 01/27/18 1808 01/28/18 0658  BP: 116/69 116/70 122/65 117/75  Pulse: 77 67 77 74  Resp: 18 18 18 18   Temp: 98.4 F (36.9 C) 98.6 F (37 C) 98 F (36.7 C) 98 F (36.7 C)  TempSrc: Oral Oral Oral Oral  SpO2: 97%     Weight:      Height:       General: alert, cooperative and no distress Lochia: appropriate Uterine Fundus: firm Incision: N/A DVT Evaluation: No evidence of DVT seen on physical exam. Labs: Lab  Results  Component Value Date   WBC 11.0 (H) 01/27/2018   HGB 11.5 (L) 01/27/2018   HCT 32.4 (L) 01/27/2018   MCV 85.9 01/27/2018   PLT 149 (L) 01/27/2018   CMP Latest Ref Rng & Units 01/26/2018  Glucose 65 - 99 mg/dL 79  BUN 6 - 20 mg/dL 5(L)  Creatinine 7.820.44 - 1.00 mg/dL 9.56(O0.42(L)  Sodium 130135 - 865145 mmol/L 134(L)  Potassium 3.5 - 5.1 mmol/L 3.7  Chloride 101 - 111 mmol/L 105  CO2 22 - 32 mmol/L 19(L)  Calcium 8.9 - 10.3 mg/dL 7.8(I8.4(L)  Total Protein 6.5 - 8.1 g/dL 6.5  Total Bilirubin 0.3 - 1.2 mg/dL 0.9  Alkaline Phos 38 - 126 U/L 189(H)  AST 15 - 41 U/L 23  ALT 14 - 54 U/L 28    Discharge instruction: per After Visit Summary and "Baby and Me Booklet".  After visit meds:  Allergies as of 01/28/2018      Reactions   Latex Rash   Onion Other (See Comments)   Upset stomache      Medication List    TAKE these medications   acetaminophen 500 MG tablet Commonly known as:  TYLENOL Take 500-1,000 mg by mouth every 6 (six) hours as needed for mild pain or headache.   calcium carbonate 500 MG chewable tablet Commonly known as:  TUMS - dosed in mg elemental calcium Chew 1 tablet by mouth at bedtime.   dimenhyDRINATE 50 MG tablet Commonly known as:  DRAMAMINE Take 50 mg by mouth daily as needed for nausea.   folic acid 400 MCG tablet Commonly known as:  FOLVITE Take 400 mcg by mouth daily.   ibuprofen 600 MG tablet Commonly known as:  ADVIL,MOTRIN Take 1 tablet (600 mg total) by mouth every 6 (six) hours.   prenatal multivitamin Tabs tablet Take 1 tablet by mouth at bedtime.   ranitidine 150 MG tablet Commonly known as:  ZANTAC Take 150 mg by mouth daily as needed for heartburn.       Diet: routine diet  Activity: Advance as tolerated. Pelvic rest for 6 weeks.   Outpatient follow up:6 weeks Follow up Appt:No future appointments. Follow up Visit:No follow-ups on file.  Postpartum contraception: Not Discussed  Newborn Data: Live born female  Birth Weight: 8  lb 2.5 oz (3700 g) APGAR: 6, 9  Newborn Delivery   Birth date/time:  01/26/2018 19:04:00 Delivery type:  Vaginal, Spontaneous     Baby Feeding: Breast Disposition:home with mother   01/30/2018 Cathrine Muster, DO

## 2018-03-31 ENCOUNTER — Inpatient Hospital Stay
Admit: 2018-03-31 | Discharge: 2018-03-31 | Disposition: A | Discharge status: Home / Self Care | Payer: PRIVATE HEALTH INSURANCE | Attending: Emergency Medicine

## 2018-03-31 ENCOUNTER — Emergency Department
Admit: 2018-03-31 | Payer: PRIVATE HEALTH INSURANCE | Primary: Student in an Organized Health Care Education/Training Program

## 2018-03-31 DIAGNOSIS — Z3201 Encounter for pregnancy test, result positive: Secondary | ICD-10-CM

## 2018-03-31 LAB — URINALYSIS WITH REFLEX TO CULTURE
Bilirubin Urine: NEGATIVE
Glucose, Ur: NEGATIVE
Ketones, Urine: NEGATIVE
Nitrite, Urine: NEGATIVE
Specific Gravity, UA: 1.02 (ref 1.005–1.030)
Urobilinogen, Urine: NORMAL
pH, UA: 6.5 (ref 5.0–8.0)

## 2018-03-31 LAB — MICROSCOPIC URINALYSIS
Epithelial Cells UA: 20 /HPF (ref 0–5)
RBC, UA: 10 /HPF (ref 0–2)
WBC, UA: 10 /HPF (ref 0–5)

## 2018-03-31 LAB — VAGINITIS DNA PROBE
Direct Exam: NEGATIVE
Direct Exam: POSITIVE — AB
Direct Exam: POSITIVE — AB

## 2018-03-31 LAB — POCT URINE PREGNANCY: Preg Test, Ur: POSITIVE

## 2018-03-31 LAB — HCG, QUANTITATIVE, PREGNANCY: hCG Quant: 18135 IU/L — ABNORMAL HIGH (ref <5)

## 2018-03-31 MED ORDER — CEFTRIAXONE SODIUM 250 MG IJ SOLR
250 MG | Freq: Once | INTRAMUSCULAR | Status: AC
Start: 2018-03-31 — End: 2018-03-31
  Administered 2018-03-31: 15:00:00 250 mg via INTRAMUSCULAR

## 2018-03-31 MED ORDER — AZITHROMYCIN 250 MG PO TABS
250 MG | Freq: Once | ORAL | Status: AC
Start: 2018-03-31 — End: 2018-03-31
  Administered 2018-03-31: 15:00:00 1000 mg via ORAL

## 2018-03-31 MED ORDER — METRONIDAZOLE 500 MG PO TABS
500 MG | ORAL_TABLET | Freq: Two times a day (BID) | ORAL | 0 refills | Status: AC
Start: 2018-03-31 — End: 2018-04-07

## 2018-03-31 MED FILL — CEFTRIAXONE SODIUM 250 MG IJ SOLR: 250 mg | INTRAMUSCULAR | Qty: 250

## 2018-03-31 MED FILL — AZITHROMYCIN 250 MG PO TABS: 250 mg | ORAL | Qty: 4

## 2018-03-31 NOTE — ED Notes (Signed)
Pt made aware of positive pregnancy test.     Hildred PriestBrittany C Chael Urenda, RN  03/31/18 1036

## 2018-03-31 NOTE — ED Notes (Signed)
Pt presents to ED with c/o vaginal odor and discharge; pt states she thinks shes having an outbreak of herpes at this time; states she thought she saw a sore; reports swelling, white discharge and a "fishy" odor.  Reports unprotected sex so she is unsure of exposure; reports last depo shot at end of Feb.     Hildred PriestBrittany C Coby Antrobus, RN  03/31/18 1119

## 2018-03-31 NOTE — ED Provider Notes (Signed)
I was present in the ED during this patient's evaluation and management by the Advance Practice Provider and was available to address any concerns about their medical management.    Lillard AnesS. Zakir Corry Storie, MD  Attending, Emergency Department      Wess BottsSyed Z Kenney Going, MD  03/31/18 [phone]

## 2018-03-31 NOTE — ED Provider Notes (Signed)
[facility]  eMERGENCY dEPARTMENT eNCOUnter      Pt Name: Kara Freeman  MRN: [id number]  Birthdate 12/30/1990  Date of evaluation: 03/31/2018  Provider: Yesica Kemler Gerarda FractionMARIE Zohaib Heeney NP, APRN - CNP    CHIEF COMPLAINT       Chief Complaint   Patient presents with   ??? Dysuria   ??? Vaginal Discharge         HISTORY OF PRESENT ILLNESS  (Location/Symptom, Timing/Onset, Context/Setting, Quality, Duration, Modifying Factors, Severity.)   Kara Freeman is a 27 y.o. female who presents to the emergency department the day by private vehicle for evaluation of vaginal discharge.  Patient states that she has had a white thick vaginal discharge with an odor.  His also had some suprapubic abdominal discomfort when she urinates and some pressure.  She states that she's been having some urinary frequency.  She denies any vaginal bleeding.  She states that she is due to get her Depo-Provera injection tomorrow.       Nursing Notes were reviewed.    ALLERGIES     Patient has no known allergies.    CURRENT MEDICATIONS       Discharge Medication List as of 03/31/2018 12:59 PM      CONTINUE these medications which have NOT CHANGED    Details   ibuprofen (ADVIL;MOTRIN) 800 MG tablet Take 1 tablet by mouth every 8 hours as needed for Pain or Fever, Disp-30 tablet, R-0Print      ondansetron (ZOFRAN ODT) 4 MG disintegrating tablet Take 1 tablet by mouth every 4 hours as needed for Nausea or Vomiting, Disp-20 tablet, R-0Print             PAST MEDICAL HISTORY         Diagnosis Date   ??? Herpes    ??? Herpes        SURGICAL HISTORY           Procedure Laterality Date   ??? CESAREAN SECTION           FAMILY HISTORY     History reviewed. No pertinent family history.  No family status information on file.        SOCIAL HISTORY      reports that she has never smoked. She has never used smokeless tobacco. She reports that she does not drink alcohol or use drugs.    REVIEW OF SYSTEMS    (2-9 systems for level 4, 10 or more for level 5)     Review of Systems    Constitutional: Negative for chills, fever and unexpected weight change.   HENT: Negative for congestion, rhinorrhea, sinus pressure and sore throat.    Respiratory: Negative for cough, shortness of breath and wheezing.    Cardiovascular: Negative for chest pain and palpitations.   Gastrointestinal: Negative for abdominal pain, constipation, diarrhea, nausea and vomiting.   Genitourinary: Positive for frequency. Negative for dysuria and hematuria.   Musculoskeletal: Negative for arthralgias and myalgias.   Skin: Negative for color change and rash.   Neurological: Negative for dizziness, weakness and headaches.   Hematological: Negative for adenopathy.        Except as noted above the remainder of the review of systems was reviewed and negative.     PHYSICAL EXAM    (up to 7 for level 4, 8 or more for level 5)     ED Triage Vitals [03/31/18 1017]   BP Temp Temp Source Pulse Resp SpO2 Height Weight   117/64  98.1 ??F (36.7 ??C) Oral 95 16 98 % 5\' 7"  (1.702 m) 208 lb (94.3 kg)       Physical Exam   Constitutional: She is oriented to person, place, and time. She appears well-developed and well-nourished.   HENT:   Head: Normocephalic and atraumatic.   Mouth/Throat: Oropharynx is clear and moist.   Eyes: Pupils are equal, round, and reactive to light. Conjunctivae are normal.   Neck: Normal range of motion. Neck supple.   Cardiovascular: Normal rate and regular rhythm.   Pulmonary/Chest: Effort normal and breath sounds normal. No stridor. No respiratory distress.   Abdominal: Soft. Bowel sounds are normal.   Musculoskeletal: Normal range of motion.   Lymphadenopathy:     She has no cervical adenopathy.   Neurological: She is alert and oriented to person, place, and time.   Skin: Skin is warm and dry. No rash noted.   Psychiatric: She has a normal mood and affect.   Vitals reviewed.      RADIOLOGY:   Non-plain film images such as CT, Ultrasound and MRI are read by the radiologist. Plain radiographic images are visualized  and preliminarily interpreted by the emergency physician with the below findings:    US Ob 1 Or More Fetus Limited    Result Date: 03/31/2018  EXAMINATION: TRIMESTER OBSTETRIC ULTRASOUND 03/31/2018 12:04 pm COMPARISON: None. HISTORY: ORDERING SYSTEM PROVIDED HISTORY: pelvic pain TECHNIQUE: Transabdominal and transvaginal obstetric pelvic ultrasound was performed with color Doppler flow evaluation. FINDINGS: GENERAL OBSERVATIONS: PREGNANCY:   Single CARDIAC ACTIVITY:  Yes FETAL HEART RATE: 145 beats per minute FETAL BODY & LIMB MOVEMENTS:  Yes FETAL POSITION:  Cephalic PLACENTA LOCATION:  Anterior ESTIMATED FETAL AGE: CURRENT [place]:  17 weeks, 4 days ESTIMATED FETAL WEIGHT:   182 grams MEASUREMENTS: BPD: 3.98 cm HEAD CIRCUMFERENCE:  14.9 cm ABD. CIRCUMFERENCE:  11.54 cm FEMUR LENGTH:  2.2 cm CERVICAL LENGTH:  4.1 cm.     A single live intrauterine pregnancy with estimated gestational age of [redacted] weeks, 4 days by ultrasound.  The estimated fetal weight is 182 g.     Interpretation per the Radiologist below, if available at the time of this note:    US OB 1 OR MORE FETUS LIMITED   Final Result   A single live intrauterine pregnancy with estimated gestational age of [redacted]   weeks, 4 days by ultrasound.  The estimated fetal weight is 182 g.                 LABS:  Labs Reviewed   VAGINITIS DNA PROBE - Abnormal; Notable for the following components:       Result Value    Direct Exam POSITIVE for Candida sp. (*)     Direct Exam POSITIVE for Gardnerella vaginalis. (*)     All other components within normal limits   URINE RT REFLEX TO CULTURE - Abnormal; Notable for the following components:    Turbidity UA CLOUDY (*)     Urine Hgb 2+ (*)     Protein, UA TRACE (*)     Leukocyte Esterase, Urine LARGE (*)     All other components within normal limits   HCG, QUANTITATIVE, PREGNANCY - Abnormal; Notable for the following components:    hCG Quant 18,135 (*)     All other components within normal limits   MICROSCOPIC URINALYSIS - Abnormal;  Notable for the following components:    Amorphous, UA 2+ (*)     All other components within normal limits  POCT URINE PREGNANCY - Normal   C.TRACHOMATIS N.GONORRHOEAE DNA   URINE CULTURE CLEAN CATCH       All other labs were within normal range or not returned as of this dictation.    EMERGENCY DEPARTMENT COURSE and DIFFERENTIAL DIAGNOSIS/MDM:   Vitals:    Vitals:    03/31/18 1017   BP: 117/64   Pulse: 95   Resp: 16   Temp: 98.1 ??F (36.7 ??C)   TempSrc: Oral   SpO2: 98%   Weight: 208 lb (94.3 kg)   Height: 5\' 7"  (1.702 m)       Medical Decision Making: Patient found out she was pregnant today.  Her Quant was 1800% her ultrasound reveals an intrauterine pregnancy of 17 weeks and 4 days.  She is able to be treated for bacterial vaginosis with Flagyl.  She was told to contact her ObGyn regarding treatment for the Candida in pregnancy.  FINAL IMPRESSION      1. Positive pregnancy test    2. BV (bacterial vaginosis)          DISPOSITION/PLAN   DISPOSITION Decision To Discharge 03/31/2018 12:58:29 PM      PATIENT REFERRED TO:   [facility] ED  [address] [place] [address]  [phone]    If symptoms worsen    Deri Fuelling, MD  [address]  [place] [address]  [phone]    Call         DISCHARGE MEDICATIONS:     Discharge Medication List as of 03/31/2018 12:59 PM      START taking these medications    Details   metroNIDAZOLE (FLAGYL) 500 MG tablet Take 1 tablet by mouth 2 times daily for 7 days, Disp-14 tablet, R-0Print                 (Please note that portions of this note were completed with a voice recognition program.  Efforts were made to edit the dictations but occasionally words are mis-transcribed.)    Janasia Coverdale Gerarda Fraction NP, APRN - CNP  Certified Nurse Practitioner          Cecille Rubin, APRN - CNP  03/31/18 1828

## 2018-04-01 LAB — C.TRACHOMATIS N.GONORRHOEAE DNA
C. trachomatis DNA: NEGATIVE
N. gonorrhoeae DNA: NEGATIVE

## 2018-04-01 LAB — URINE CULTURE CLEAN CATCH: Culture: NO GROWTH

## 2018-04-01 LAB — POCT URINE PREGNANCY: HCG, Pregnancy Urine (POC): POSITIVE — AB

## 2018-10-08 ENCOUNTER — Inpatient Hospital Stay
Admit: 2018-10-08 | Discharge: 2018-10-08 | Disposition: A | Discharge status: Home / Self Care | Payer: PRIVATE HEALTH INSURANCE | Attending: Emergency Medicine

## 2018-10-08 DIAGNOSIS — D649 Anemia, unspecified: Secondary | ICD-10-CM

## 2018-10-08 LAB — CBC WITH AUTO DIFFERENTIAL
Absolute Eos #: 0.15 10*3/uL (ref 0.00–0.44)
Absolute Immature Granulocyte: 0.02 10*3/uL (ref 0.00–0.30)
Absolute Lymph #: 1.82 10*3/uL (ref 1.10–3.70)
Absolute Mono #: 0.37 10*3/uL (ref 0.10–1.20)
Basophils Absolute: 0.06 10*3/uL (ref 0.00–0.20)
Basophils: 1 % (ref 0–2)
Eosinophils %: 3 % (ref 1–4)
Hematocrit: 36.1 % — ABNORMAL LOW (ref 36.3–47.1)
Hemoglobin: 10.7 g/dL — ABNORMAL LOW (ref 11.9–15.1)
Immature Granulocytes: 0 %
Lymphocytes: 36 % (ref 24–43)
MCH: 26.4 pg (ref 25.2–33.5)
MCHC: 29.6 g/dL (ref 28.4–34.8)
MCV: 88.9 fL (ref 82.6–102.9)
MPV: 10.9 fL (ref 8.1–13.5)
Monocytes: 7 % (ref 3–12)
NRBC Automated: 0 per 100 WBC
Platelets: 316 10*3/uL (ref 138–453)
RBC: 4.06 m/uL (ref 3.95–5.11)
RDW: 13.2 % (ref 11.8–14.4)
Seg Neutrophils: 53 % (ref 36–65)
Segs Absolute: 2.7 10*3/uL (ref 1.50–8.10)
WBC: 5.1 10*3/uL (ref 3.5–11.3)

## 2018-10-08 LAB — BASIC METABOLIC PANEL
Anion Gap: 10 mmol/L (ref 9–17)
BUN: 11 mg/dL (ref 6–20)
Bun/Cre Ratio: 15 (ref 9–20)
CO2: 24 mmol/L (ref 20–31)
Calcium: 8.9 mg/dL (ref 8.6–10.4)
Chloride: 104 mmol/L (ref 98–107)
Creatinine: 0.75 mg/dL (ref 0.50–0.90)
GFR African American: >60 mL/min (ref >60)
GFR Non-African American: >60 mL/min (ref >60)
Glucose: 85 mg/dL (ref 70–99)
Potassium: 4.6 mmol/L (ref 3.7–5.3)
Sodium: 138 mmol/L (ref 135–144)

## 2018-10-08 NOTE — ED Provider Notes (Signed)
eMERGENCY dEPARTMENT eNCOUnter   Independent Attestation     Pt Name: Kara Freeman  MRN: [id number]  Birthdate 12/03/1990  Date of evaluation: 10/08/18     Kara Freeman is a 27 y.o. female with CC: Dizziness (low hgb after having baby) and Vaginal Bleeding      Based on the medical record the care appears appropriate.  I was personally available for consultation in the Emergency Department.    Barbee CoughNabil J. Lucile Hillmann, MD  Attending Emergency Physician                    Josem KaufmannNabil Jorel Gravlin, MD  10/08/18 [phone]

## 2018-10-08 NOTE — ED Provider Notes (Signed)
[facility] ED  eMERGENCY dEPARTMENT eNCOUnter      Pt Name: Kara Freeman  MRN: [id number]  Birthdate 10/13/1991  Date of evaluation: 10/08/2018  Provider: Carolyn Stare, APRN - CNP    CHIEF COMPLAINT       Chief Complaint   Patient presents with   ??? Dizziness     low hgb after having baby   ??? Vaginal Bleeding         HISTORY OF PRESENT ILLNESS  (Location/Symptom, Timing/Onset, Context/Setting, Quality, Duration, Modifying Factors, Severity.)   Kara Freeman is a 27 y.o. female who presents to the emergency department via private auto for dizziness, fatigue. States she had a c-section on 08-29-18. She reports her Hgb was 6 when she left the hospital. She has been taking iron. Reports going back to work a month early. States she now has increased fatigue. She has continued vaginal bleeding. Denies fever, chills, abdominal pain, SOB. Rates her pain 7/10 at this time. She saw her OB/GYN for follow up on 10-02-18. States she had to leave work early today. She is accompanied by family. She has been taking ferrous sulfate and motrin as directed by her OB/GYN.      Nursing Notes were reviewed.    ALLERGIES     Patient has no known allergies.    CURRENT MEDICATIONS       Previous Medications    FERROUS SULFATE 325 (65 FE) MG EC TABLET    Take by mouth    IBUPROFEN (ADVIL;MOTRIN) 800 MG TABLET    Take 1 tablet by mouth every 8 hours as needed for Pain or Fever    ONDANSETRON (ZOFRAN ODT) 4 MG DISINTEGRATING TABLET    Take 1 tablet by mouth every 4 hours as needed for Nausea or Vomiting       PAST MEDICAL HISTORY         Diagnosis Date   ??? Herpes    ??? Herpes        SURGICAL HISTORY           Procedure Laterality Date   ??? CESAREAN SECTION     ??? TUBAL LIGATION           FAMILY HISTORY     History reviewed. No pertinent family history.  No family status information on file.        SOCIAL HISTORY      reports that she has never smoked. She has never used smokeless tobacco. She reports that she does not drink alcohol or  use drugs.    REVIEW OF SYSTEMS    (2-9 systems for level 4, 10 or more for level 5)     Review of Systems   Constitutional: Positive for fatigue. Negative for chills, diaphoresis and fever.   Respiratory: Negative for shortness of breath.    Cardiovascular: Negative for chest pain.   Gastrointestinal: Negative for abdominal pain, diarrhea, nausea and vomiting.   Musculoskeletal: Negative for arthralgias, myalgias and neck pain.   Skin: Negative for color change and rash.   Neurological: Positive for dizziness and light-headedness. Negative for facial asymmetry, speech difficulty, weakness, numbness and headaches.      Except as noted above the remainder of the review of systems was reviewed and negative.     PHYSICAL EXAM    (up to 7 for level 4, 8 or more for level 5)     ED Triage Vitals   BP Temp Temp src Pulse Resp SpO2  Height Weight   10/08/18 1004 10/08/18 1004 -- 10/08/18 1004 10/08/18 1004 10/08/18 1004 10/08/18 1003 10/08/18 1003   114/69 97.3 ??F (36.3 ??C)  70 16 100 % 5\' 6"  (1.676 m) 202 lb 12.8 oz (92 kg)     Physical Exam  Vitals signs reviewed.   Constitutional:       General: She is not in acute distress.     Appearance: She is well-developed. She is not diaphoretic.   HENT:      Right Ear: External ear normal.      Left Ear: External ear normal.      Mouth/Throat:      Mouth: Mucous membranes are moist.   Eyes:      Extraocular Movements: Extraocular movements intact.      Conjunctiva/sclera: Conjunctivae normal.      Pupils: Pupils are equal, round, and reactive to light.   Cardiovascular:      Rate and Rhythm: Normal rate and regular rhythm.      Heart sounds: Normal heart sounds.   Pulmonary:      Effort: Pulmonary effort is normal. No respiratory distress.      Breath sounds: Normal breath sounds. No wheezing or rales.   Abdominal:      General: There is no distension.      Palpations: Abdomen is soft.      Tenderness: There is no tenderness.   Skin:     General: Skin is warm and dry.       Capillary Refill: Capillary refill takes less than 2 seconds.      Findings: No rash.   Neurological:      Mental Status: She is alert and oriented to person, place, and time.      GCS: GCS eye subscore is 4. GCS verbal subscore is 5. GCS motor subscore is 6.      Cranial Nerves: No cranial nerve deficit.      Motor: Motor function is intact. No weakness.      Coordination: Coordination is intact.      Gait: Gait normal.   Psychiatric:         Mood and Affect: Mood normal.         Behavior: Behavior normal.         DIAGNOSTIC RESULTS     LABS:  Labs Reviewed   CBC WITH AUTO DIFFERENTIAL - Abnormal; Notable for the following components:       Result Value    Hemoglobin 10.7 (*)     Hematocrit 36.1 (*)     All other components within normal limits   BASIC METABOLIC PANEL       All other labs were within normal range or not returned as of this dictation.    EMERGENCY DEPARTMENT COURSE and DIFFERENTIAL DIAGNOSIS/MDM:   Vitals:    Vitals:    10/08/18 1003 10/08/18 1004   BP:  114/69   Pulse:  70   Resp:  16   Temp:  97.3 ??F (36.3 ??C)   SpO2:  100%   Weight: 202 lb 12.8 oz (92 kg)    Height: 5\' 6"  (1.676 m)        CLINICAL DECISION MAKING:  The patient presented alert with a nontoxic appearance and was seen in conjunction with Dr. Courtney Paris. Hgb was 10.7 which was improved from previous. Continue recommendations from OB/GYN. Follow up with OB/GYN, return to ED if condition worsens.        FINAL IMPRESSION  1. Anemia, unspecified type    2. Recent childbirth              DISPOSITION/PLAN   DISPOSITION Decision To Discharge 10/08/2018 11:48:14 AM      PATIENT REFERRED TO:   Clementeen HoofJackie Vannuyen, MD  [address]  [address]  [place] [id number]  [phone]    Schedule an appointment as soon as possible for a visit       [facility]  [address]  [place] [id number]  [phone]    As needed, If symptoms worsen      DISCHARGE MEDICATIONS:     New Prescriptions    No medications on file           (Please note that  portions of this note were completed with a voice recognition program.  Efforts were made to edit the dictations but occasionally words are mis-transcribed.)    Carolyn StareKristin M Daralyn Bert, APRN - CNP      Carolyn StareKristin M Synai Prettyman, APRN - CNP  10/08/18 1551

## 2018-10-08 NOTE — ED Notes (Signed)
Pt post partum 09/01/18, states she had tubal ligation done at the same time, lost "a liter and half of blood", pt states hemoglobin was down to six, given iron pills and told to f/u.  Pt had repeat hemoglobin a week later and was still 6, pt states today at work she was feeling light headed and weak.  Pt arrives with mother she is alert and oriented, slightly pale in color, declines getting in a gown for evaluation.      Libby Mawara M Francetta Ilg, RN  10/08/18 1036

## 2019-08-20 ENCOUNTER — Other Ambulatory Visit: Payer: Self-pay

## 2019-08-20 DIAGNOSIS — Z20822 Contact with and (suspected) exposure to covid-19: Secondary | ICD-10-CM

## 2019-08-23 LAB — NOVEL CORONAVIRUS, NAA: SARS-CoV-2, NAA: NOT DETECTED

## 2019-08-26 IMAGING — US US MFM OB DETAIL+14 WK
2 series · 14 of 28 positions shown · non-contrast
Comparison: none

[Series 1: us mfm ob detail+14 wk · 11 of 77 slices shown (1 of 2)]
[im 4/77]
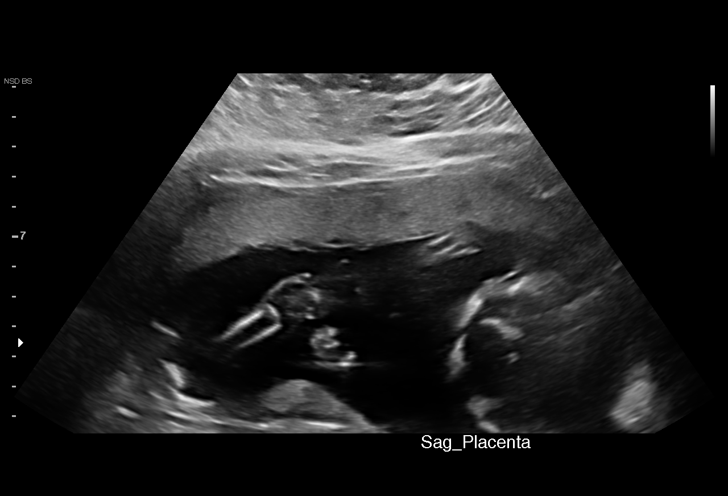
[im 11/77]
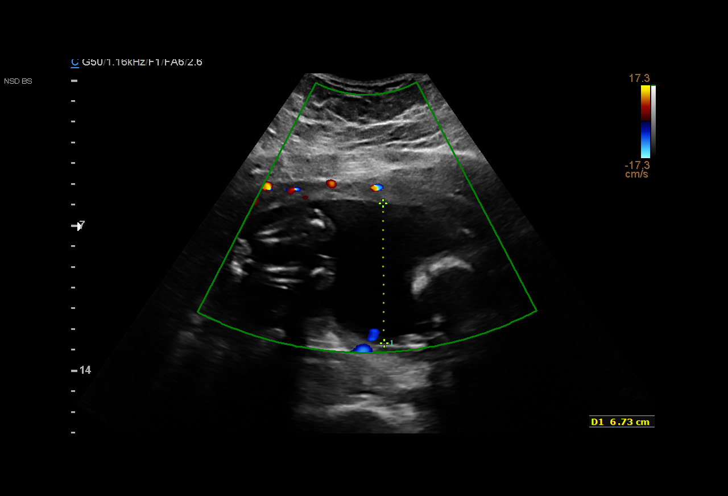
[im 19/77]
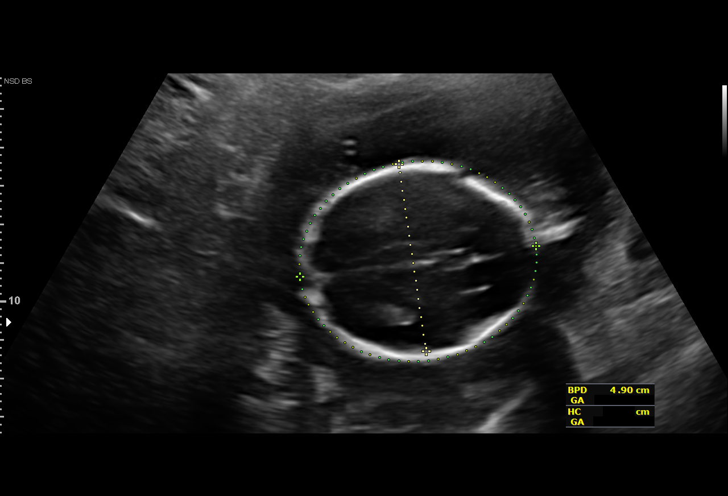
[im 26/77]
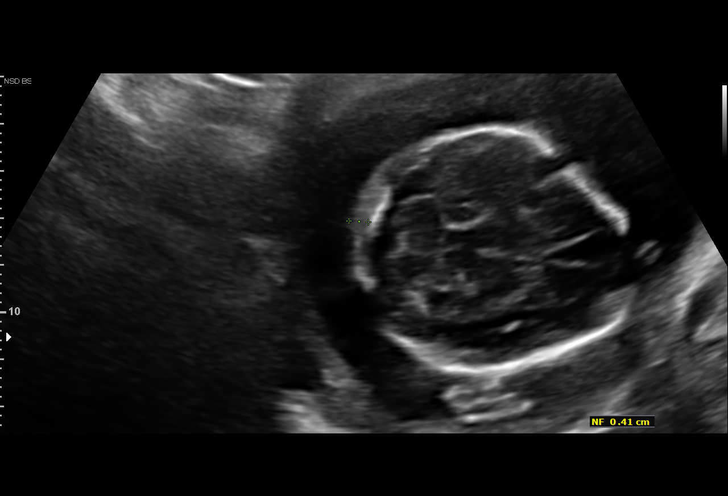
[im 33/77]
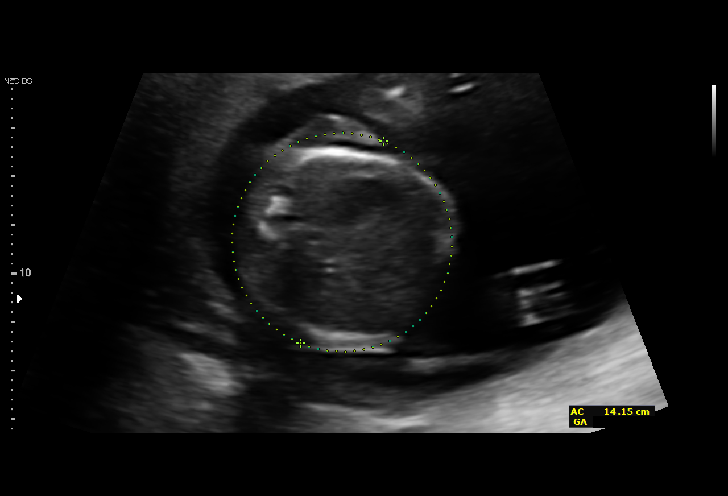
[im 40/77]
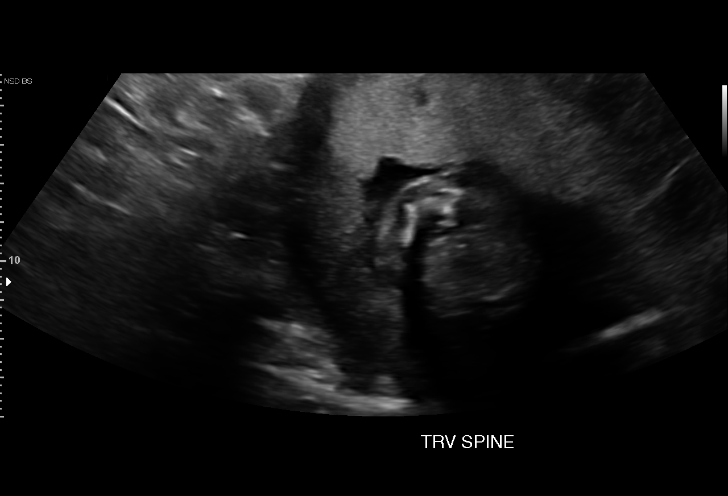
[im 48/77]
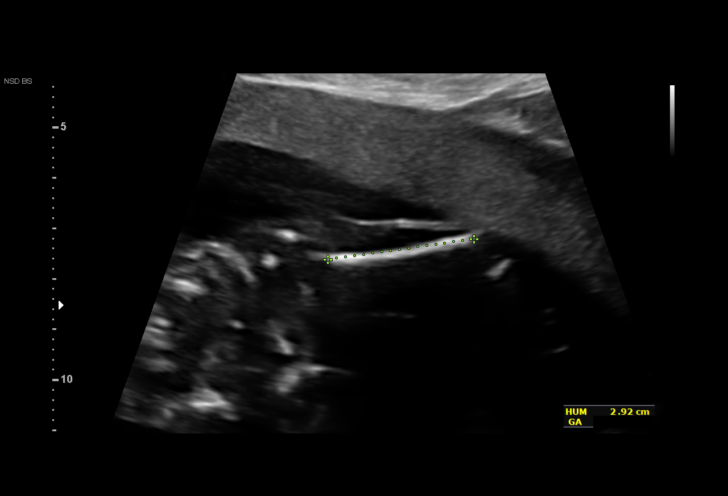
[im 55/77]
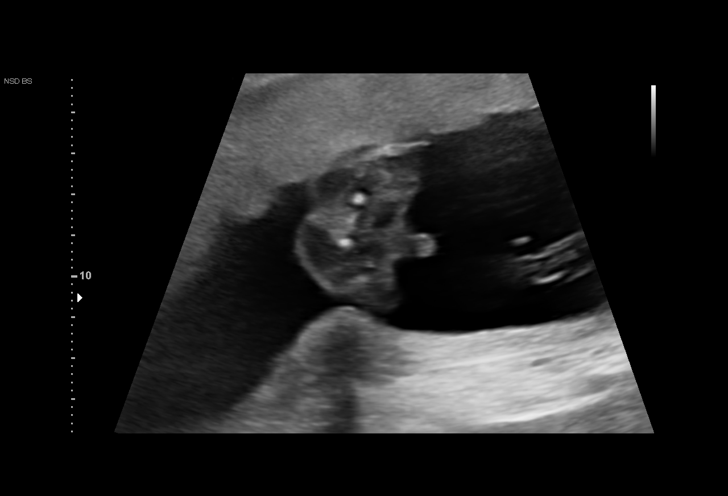
[im 62/77]
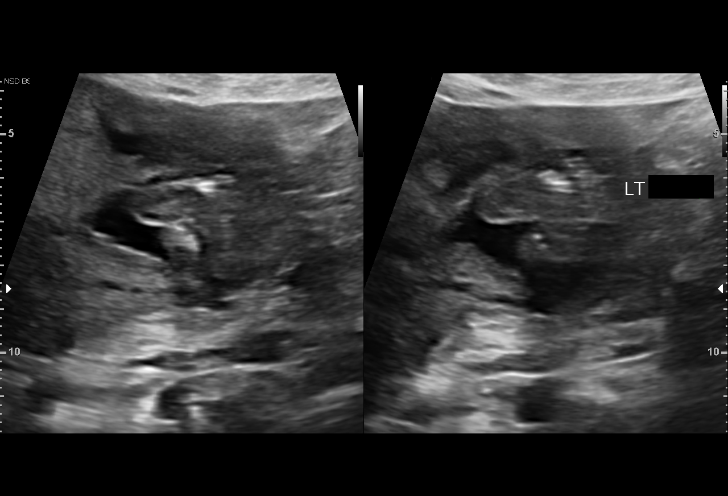
[im 69/77]
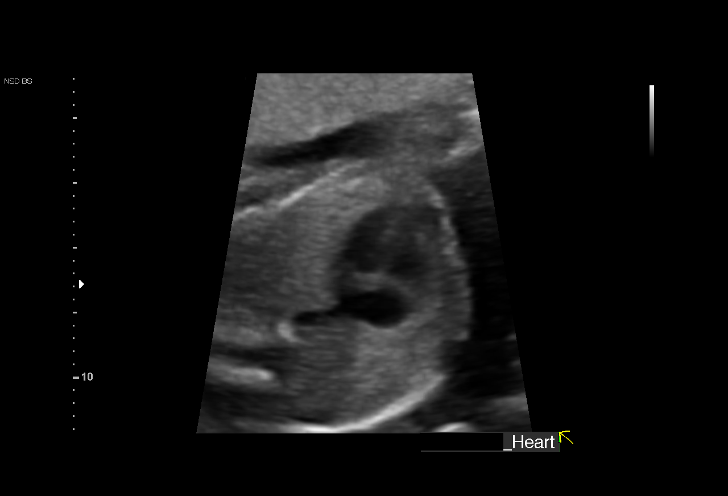
[im 77/77]
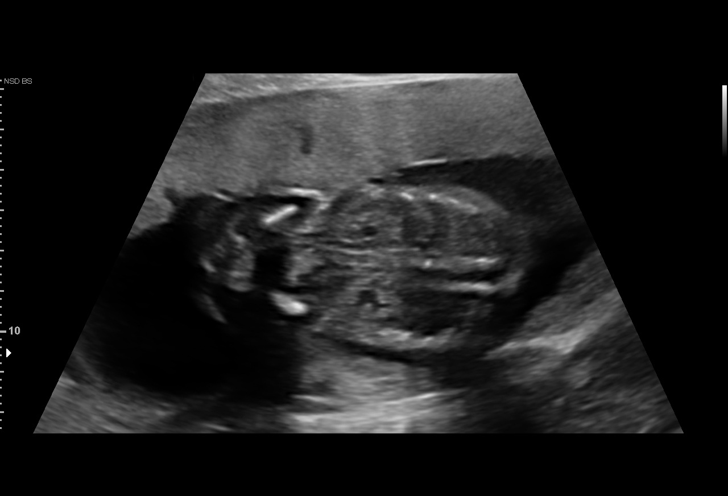

[Series 3: us mfm ob detail+14 wk · 19 acquisitions, 3 frames shown (2 of 2)]
[im 4/19]
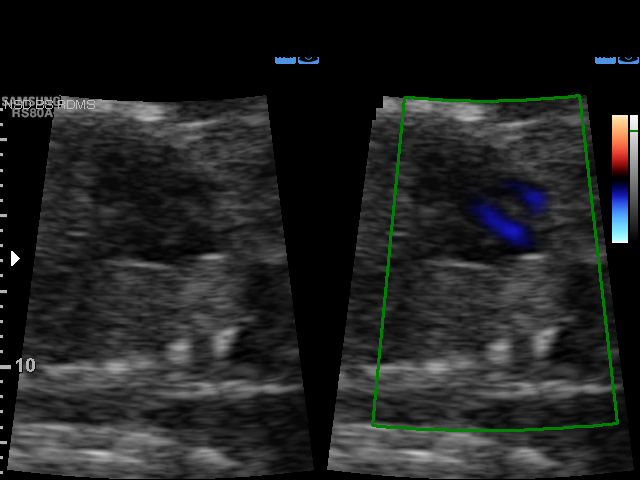
[im 11/19]
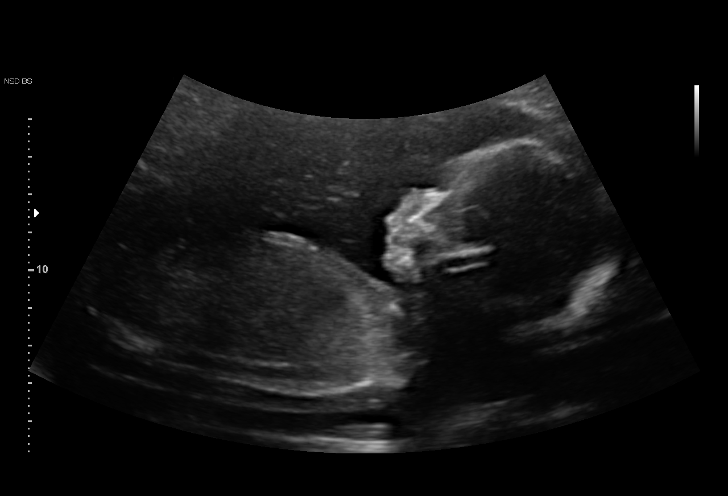
[im 19/19]
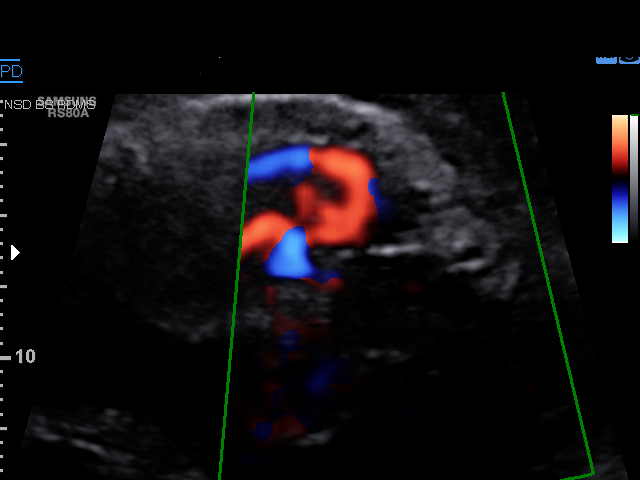

[14 of 28 positions shown; findings below may reference images not displayed]

OBGYN
[address]

1  ALANR AUGUSTOWSKA             [id number]      [phone]     [id number]
BENNETT
Indications

19 weeks gestation of pregnancy
Encounter for fetal anatomic survey
Maternal morbid obesity
OB History

Gravidity:    1         Term:   0        Prem:   0        SAB:   0
TOP:          0       Ectopic:  0        Living: 0
Fetal Evaluation

Num Of Fetuses:     1
Fetal Heart         157
Rate(bpm):
Cardiac Activity:   Observed
Presentation:       Cephalic
Placenta:           Anterior, above cervical os
P. Cord Insertion:  Marginal insertion

Amniotic Fluid
AFI FV:      Subjectively within normal limits

Largest Pocket(cm)
6.7
Biometry

BPD:      49.7  mm     G. Age:  21w 0d         90  %    CI:        77.59   %    70 - 86
FL/HC:      18.5   %    16.8 -
HC:      178.6  mm     G. Age:  20w 2d         63  %    HC/AC:      1.25        1.09 -
AC:      143.4  mm     G. Age:  19w 4d         39  %    FL/BPD:     66.4   %
FL:         33  mm     G. Age:  20w 2d         59  %    FL/AC:      23.0   %    20 - 24
HUM:      28.3  mm     G. Age:  19w 1d         34  %
CER:      19.6  mm     G. Age:  18w 6d         24  %
NFT:       4.1  mm

CM:        6.4  mm

Est. FW:     329  gm    0 lb 12 oz      52  %
Gestational Age

LMP:           21w 2d        Date:  04/09/17                 EDD:   01/14/18
U/S Today:     20w 2d                                        EDD:   01/21/18
Best:          19w 6d     Det. By:  Early Ultrasound         EDD:   01/24/18
Anatomy

Cranium:               Appears normal         Aortic Arch:            Appears normal
Cavum:                 Appears normal         Ductal Arch:            Appears normal
Ventricles:            Appears normal         Diaphragm:              Appears normal
Choroid Plexus:        Appears normal         Stomach:                Appears normal, left
sided
Cerebellum:            Appears normal         Abdomen:                Appears normal
Posterior Fossa:       Appears normal         Abdominal Wall:         Appears nml (cord
insert, abd wall)
Nuchal Fold:           Appears normal         Cord Vessels:           Appears normal (3
vessel cord)
Face:                  Appears normal         Kidneys:                Appear normal
(orbits and profile)
Lips:                  Appears normal         Bladder:                Appears normal
Thoracic:              Appears normal         Spine:                  Appears normal
Heart:                 Appears normal         Upper Extremities:      Visualized
(4CH, axis, and
situs)
RVOT:                  Appears normal         Lower Extremities:      Visualized
LVOT:                  Appears normal

Other:  Parents do not wish to know sex of fetus at this time (Gender Reveal).
Technically difficult due to maternal habitus and fetal position.
Cervix Uterus Adnexa

Cervix
Length:            3.2  cm.
Normal appearance by transabdominal scan.

Left Ovary
Not visualized.

Right Ovary
Within normal limits.

Adnexa:       No abnormality visualized.
Impression

Singleton intrauterine pregnancy at 19+6 weeks with fetal
cardiac activity
Cephalic presentation
Anterior placenta
Normal appearing detailed fetal anatomy
Markers of aneuploidy: none
Normal amniotic fluid volume
Measurements consistent with prior US
Recommendations

Follow-up ultrasounds as clinically indicated.

## 2019-09-23 ENCOUNTER — Other Ambulatory Visit: Payer: Self-pay

## 2019-09-23 DIAGNOSIS — Z20822 Contact with and (suspected) exposure to covid-19: Secondary | ICD-10-CM

## 2019-09-25 LAB — NOVEL CORONAVIRUS, NAA: SARS-CoV-2, NAA: NOT DETECTED

## 2019-10-13 ENCOUNTER — Inpatient Hospital Stay
Admit: 2019-10-13 | Discharge: 2019-10-13 | Disposition: A | Discharge status: Home / Self Care | Payer: PRIVATE HEALTH INSURANCE | Attending: Emergency Medicine

## 2019-10-13 DIAGNOSIS — R519 Headache, unspecified: Secondary | ICD-10-CM

## 2019-10-13 MED ORDER — BUTALBITAL-APAP-CAFFEINE 50-300-40 MG PO CAPS
50-300-40 MG | ORAL_CAPSULE | Freq: Four times a day (QID) | ORAL | 0 refills | Status: DC | PRN
Start: 2019-10-13 — End: 2020-10-27

## 2019-10-13 MED ORDER — KETOROLAC TROMETHAMINE 60 MG/2ML IM SOLN
602 MG/2ML | Freq: Once | INTRAMUSCULAR | Status: AC
Start: 2019-10-13 — End: 2019-10-13
  Administered 2019-10-13: 13:00:00 60 mg via INTRAMUSCULAR

## 2019-10-13 MED FILL — KETOROLAC TROMETHAMINE 60 MG/2ML IM SOLN: 60 MG/2ML | INTRAMUSCULAR | Qty: 2

## 2019-10-13 NOTE — ED Provider Notes (Signed)
[facility]  EMERGENCY DEPARTMENT ENCOUNTER      Pt Name: Kara Freeman  MRN: [id number]  Birthdate 07/18/1991  Date of evaluation: 10/13/2019  Provider: Cherie OuchJeffery Wileen Duncanson, MD    CHIEF COMPLAINT       Chief Complaint   Patient presents with   ??? Headache         HISTORY OF PRESENT ILLNESS  (Location/Symptom, Timing/Onset, Context/Setting, Quality, Duration, Modifying Factors, Severity.)   Kara Freeman is a 28 y.o. female who presents to the emergency department for headache.  She has had it for a few days and its on the left side of her head.  No trauma fever or stiff neck.  She frequently gets headaches like this and she rated the pain as an 8.  No weakness numbness or vomiting.  The headache is continuous.  She took Excedrin and Tylenol at home.      Nursing Notes were reviewed.    ALLERGIES     Patient has no known allergies.    CURRENT MEDICATIONS       Previous Medications    FERROUS SULFATE 325 (65 FE) MG EC TABLET    Take by mouth    IBUPROFEN (ADVIL;MOTRIN) 800 MG TABLET    Take 1 tablet by mouth every 8 hours as needed for Pain or Fever    ONDANSETRON (ZOFRAN ODT) 4 MG DISINTEGRATING TABLET    Take 1 tablet by mouth every 4 hours as needed for Nausea or Vomiting       PAST MEDICAL HISTORY         Diagnosis Date   ??? Herpes    ??? Herpes        SURGICAL HISTORY           Procedure Laterality Date   ??? CESAREAN SECTION     ??? TUBAL LIGATION           FAMILY HISTORY     No family history on file.  No family status information on file.        SOCIAL HISTORY      reports that she has never smoked. She has never used smokeless tobacco. She reports that she does not drink alcohol or use drugs.    REVIEW OF SYSTEMS    (2-9 systems for level 4, 10 or more for level 5)     Review of Systems   Constitutional: Negative for chills, fatigue and fever.   HENT: Negative for congestion, ear discharge and facial swelling.    Eyes: Negative for discharge and redness.   Respiratory: Negative for cough and shortness of breath.     Cardiovascular: Negative for chest pain.   Gastrointestinal: Negative for abdominal pain, constipation, diarrhea and vomiting.   Genitourinary: Negative for dysuria and hematuria.   Musculoskeletal: Negative for arthralgias.   Skin: Negative for color change and rash.   Neurological: Positive for headaches. Negative for syncope and numbness.   Hematological: Negative for adenopathy.   Psychiatric/Behavioral: Negative for confusion. The patient is not nervous/anxious.         Except as noted above the remainder of the review of systems was reviewed and negative.     PHYSICAL EXAM    (up to 7 for level 4, 8 or more for level 5)     Vitals:    10/13/19 0738 10/13/19 0741   BP:  125/71   Pulse:  69   Resp:  14   Temp:  98.4 ??F (36.9 ??C)  SpO2:  99%   Weight: 216 lb 14.4 oz (98.4 kg)    Height: 5\' 5"  (1.651 m)        Physical Exam  Vitals signs reviewed.   Constitutional:       General: She is not in acute distress.     Appearance: She is well-developed. She is not diaphoretic.   HENT:      Head: Normocephalic and atraumatic.      Comments: Neck supple, no nuchal rigidity  Eyes:      General: No scleral icterus.        Right eye: No discharge.         Left eye: No discharge.   Neck:      Musculoskeletal: Neck supple.   Cardiovascular:      Rate and Rhythm: Normal rate and regular rhythm.   Pulmonary:      Effort: Pulmonary effort is normal. No respiratory distress.      Breath sounds: Normal breath sounds. No stridor. No wheezing or rales.   Abdominal:      General: There is no distension.      Palpations: Abdomen is soft.      Tenderness: There is no abdominal tenderness.   Musculoskeletal: Normal range of motion.   Lymphadenopathy:      Cervical: No cervical adenopathy.   Skin:     General: Skin is warm and dry.      Findings: No erythema or rash.   Neurological:      Mental Status: She is alert and oriented to person, place, and time.      Comments: Upper lower extremity strength intact   Psychiatric:          Behavior: Behavior normal.             DIAGNOSTIC RESULTS     EKG: All EKG's are interpreted by the Emergency Department Physician who either signs or Co-signs this chart in the absence of a cardiologist.    Not indicated    RADIOLOGY:   Non-plain film images such as CT, Ultrasound and MRI are read by the radiologist. Plain radiographic images are visualized and preliminarily interpreted by the emergency physician with the below findings:    Not indicated    Interpretation per the Radiologist below, if available at the time of this note:        LABS:  Labs Reviewed - No data to display    All other labs were within normal range or not returned as of this dictation.    EMERGENCY DEPARTMENT COURSE and DIFFERENTIAL DIAGNOSIS/MDM:   Vitals:    Vitals:    10/13/19 0738 10/13/19 0741   BP:  125/71   Pulse:  69   Resp:  14   Temp:  98.4 ??F (36.9 ??C)   SpO2:  99%   Weight: 216 lb 14.4 oz (98.4 kg)    Height: 5\' 5"  (1.651 m)        Orders Placed This Encounter   Medications   ??? ketorolac (TORADOL) injection 60 mg   ??? butalbital-APAP-caffeine (FIORICET) 50-300-40 MG CAPS per capsule     Sig: Take 1 capsule by mouth every 6 hours as needed for Headaches     Dispense:  20 capsule     Refill:  0       Medical Decision Making: She was given IM Toradol and prescribed Fioricet and given a work note.  I have no clinical suspicion of acute intracranial pathology.  Treatment  diagnosis and follow-up were discussed with the patient.      CONSULTS:  None    PROCEDURES:  None    FINAL IMPRESSION      1. Acute nonintractable headache, unspecified headache type          DISPOSITION/PLAN   DISPOSITION Decision To Discharge 10/13/2019 07:52:00 AM      PATIENT REFERRED TO:   [facility]  [address] [place] [address]  [phone]    If symptoms worsen      DISCHARGE MEDICATIONS:     New Prescriptions    BUTALBITAL-APAP-CAFFEINE (FIORICET) 50-300-40 MG CAPS PER CAPSULE    Take 1 capsule by mouth every 6 hours as needed for  Headaches         (Please note that portions of this note were completed with a voice recognition program.  Efforts were made to edit the dictations but occasionally words are mis-transcribed.)    Cherie Ouch, MD  Attending Emergency Physician            Cherie Ouch, MD  10/13/19 [phone]

## 2019-10-13 NOTE — ED Notes (Signed)
Pt to ed with c/o headache. Pt states she has had s/sx for past 4 days. Pt denies n/v. Pt denies recent fever or chills. Pt states she has an appointment soon for new pcp. Pt a&ox3. Skin warm and dry. Respirations even and non-labored.      Charm Rings, RN  10/13/19 [phone]

## 2019-11-10 ENCOUNTER — Ambulatory Visit: Payer: PRIVATE HEALTH INSURANCE | Attending: Internal Medicine

## 2019-11-10 DIAGNOSIS — Z20822 Contact with and (suspected) exposure to covid-19: Secondary | ICD-10-CM

## 2019-11-11 LAB — NOVEL CORONAVIRUS, NAA: SARS-CoV-2, NAA: NOT DETECTED

## 2020-02-13 ENCOUNTER — Ambulatory Visit: Payer: PRIVATE HEALTH INSURANCE | Attending: Internal Medicine

## 2020-02-13 DIAGNOSIS — Z23 Encounter for immunization: Secondary | ICD-10-CM

## 2020-02-13 NOTE — Progress Notes (Signed)
   Covid-19 Vaccination Clinic  Name:  Yvette Hart    MRN: [id number] DOB: Mar 08, 1991  02/13/2020  [name] was observed post Covid-19 immunization for 15 minutes without incident. She was provided with Vaccine Information Sheet and instruction to access the V-Safe system.   [name] was instructed to call 911 with any severe reactions post vaccine: Marland Kitchen Difficulty breathing  . Swelling of face and throat  . A fast heartbeat  . A bad rash all over body  . Dizziness and weakness   Immunizations Administered    Name Date Dose VIS Date Route   Pfizer COVID-19 Vaccine 02/13/2020  3:27 PM 0.3 mL 10/09/2019 Intramuscular   Manufacturer: [employer], [employer]   Lot: [id number]   NDC: [address]-2

## 2020-02-24 ENCOUNTER — Encounter
Payer: PRIVATE HEALTH INSURANCE | Attending: Otolaryngology | Primary: Student in an Organized Health Care Education/Training Program

## 2020-03-09 ENCOUNTER — Ambulatory Visit: Payer: PRIVATE HEALTH INSURANCE | Attending: Internal Medicine

## 2020-03-09 DIAGNOSIS — Z23 Encounter for immunization: Secondary | ICD-10-CM

## 2020-03-09 NOTE — Progress Notes (Signed)
   Covid-19 Vaccination Clinic  Name:  Yvette Hart    MRN: [id number] DOB: Apr 28, 1991  03/09/2020  [name] was observed post Covid-19 immunization for 15 minutes without incident. She was provided with Vaccine Information Sheet and instruction to access the V-Safe system.   [name] was instructed to call 911 with any severe reactions post vaccine: Marland Kitchen Difficulty breathing  . Swelling of face and throat  . A fast heartbeat  . A bad rash all over body  . Dizziness and weakness   Immunizations Administered    Name Date Dose VIS Date Route   Pfizer COVID-19 Vaccine 03/09/2020  4:52 PM 0.3 mL 12/23/2018 Intramuscular   Manufacturer: [employer], [employer]   Lot: [id number]   NDC: [address]-1

## 2020-04-20 LAB — HM PAP SMEAR

## 2020-04-20 LAB — RESULTS CONSOLE HPV: CHL HPV: NEGATIVE

## 2020-10-27 NOTE — ED Provider Notes (Signed)
The patient left the emergency department before she was seen by me.  I did not see her.     Cherie Ouch, MD  10/28/20 0200

## 2020-10-28 ENCOUNTER — Inpatient Hospital Stay
Admit: 2020-10-28 | Discharge: 2020-10-28 | Discharge status: Against Medical Advice | Payer: PRIVATE HEALTH INSURANCE | Attending: Emergency Medicine

## 2020-10-31 LAB — EKG 12-LEAD
Atrial Rate: 88 {beats}/min
P Axis: 58 degrees
P-R Interval: 154 ms
Q-T Interval: 358 ms
QRS Duration: 76 ms
QTc Calculation (Bazett): 433 ms
R Axis: 46 degrees
T Axis: 28 degrees
Ventricular Rate: 88 {beats}/min

## 2021-05-16 ENCOUNTER — Other Ambulatory Visit: Payer: Self-pay

## 2021-05-16 ENCOUNTER — Telehealth: Payer: Self-pay | Admitting: Gastroenterology

## 2021-05-16 ENCOUNTER — Other Ambulatory Visit (INDEPENDENT_AMBULATORY_CARE_PROVIDER_SITE_OTHER): Payer: PRIVATE HEALTH INSURANCE

## 2021-05-16 DIAGNOSIS — K921 Melena: Secondary | ICD-10-CM

## 2021-05-16 LAB — CBC WITH DIFFERENTIAL/PLATELET
Basophils Absolute: 0 10*3/uL (ref 0.0–0.1)
Basophils Relative: 0.3 % (ref 0.0–3.0)
Eosinophils Absolute: 0.1 10*3/uL (ref 0.0–0.7)
Eosinophils Relative: 0.5 % (ref 0.0–5.0)
HCT: 42 % (ref 36.0–46.0)
Hemoglobin: 14.5 g/dL (ref 12.0–15.0)
Lymphocytes Relative: 15.6 % (ref 12.0–46.0)
Lymphs Abs: 1.7 10*3/uL (ref 0.7–4.0)
MCHC: 34.5 g/dL (ref 30.0–36.0)
MCV: 86.3 fl (ref 78.0–100.0)
Monocytes Absolute: 0.9 10*3/uL (ref 0.1–1.0)
Monocytes Relative: 8.8 % (ref 3.0–12.0)
Neutro Abs: 8 10*3/uL — ABNORMAL HIGH (ref 1.4–7.7)
Neutrophils Relative %: 74.8 % (ref 43.0–77.0)
Platelets: 224 10*3/uL (ref 150.0–400.0)
RBC: 4.86 Mil/uL (ref 3.87–5.11)
RDW: 13.1 % (ref 11.5–15.5)
WBC: 10.7 10*3/uL — ABNORMAL HIGH (ref 4.0–10.5)

## 2021-05-16 NOTE — Telephone Encounter (Signed)
7/22 at 820 am with Dr Myrtie Neither.  Lab order entered for today.  The pt is checking to see if she can get off work for that day and will call back.  I will double check with [place] to make sure I can use Dr Irving Burton spot.

## 2021-05-16 NOTE — Telephone Encounter (Signed)
I spoke with the pt and she is ok with the 7/22 appt with Dr Myrtie Neither.  She will also come in for labs today.  [place] ok'd the appt with Dr Myrtie Neither.

## 2021-05-16 NOTE — Telephone Encounter (Signed)
Dr. Cordella Register contacted me this morning. She is his office nurse. Has had dyspesia and a single dark stool,  Otherwise feels well. He advised BID PPI.  Yvette Hart, She needs OV with someone this week if possible. CBC today.  Double book with me if needed.  Thanks  Her cell is [phone]

## 2021-05-19 ENCOUNTER — Ambulatory Visit (INDEPENDENT_AMBULATORY_CARE_PROVIDER_SITE_OTHER): Payer: No Typology Code available for payment source | Admitting: Gastroenterology

## 2021-05-19 ENCOUNTER — Other Ambulatory Visit (INDEPENDENT_AMBULATORY_CARE_PROVIDER_SITE_OTHER): Payer: No Typology Code available for payment source

## 2021-05-19 ENCOUNTER — Encounter: Payer: Self-pay | Admitting: Gastroenterology

## 2021-05-19 VITALS — BP 114/72 | HR 64 | Ht 64.0 in | Wt 251.0 lb

## 2021-05-19 DIAGNOSIS — R194 Change in bowel habit: Secondary | ICD-10-CM

## 2021-05-19 DIAGNOSIS — R1030 Lower abdominal pain, unspecified: Secondary | ICD-10-CM

## 2021-05-19 DIAGNOSIS — R12 Heartburn: Secondary | ICD-10-CM

## 2021-05-19 LAB — H. PYLORI ANTIBODY, IGG: H Pylori IgG: NEGATIVE

## 2021-05-19 MED ORDER — SUCRALFATE 1 G PO TABS: ORAL_TABLET | ORAL | 0 refills | Status: DC

## 2021-05-19 NOTE — Patient Instructions (Addendum)
Your provider has requested that you go to the basement level for lab work before leaving today. Press "B" on the elevator. The lab is located at the first door on the left as you exit the elevator.  We will be glad to refer you to [facility] for further evaluation of your chronic cough. Let us know if you decide you would like [place] to do this.  We have sent the following medications to your pharmacy for you to pick up at your convenience: Sucralfate 1 gram- Dissolve 2 tablets in 2 tablespoons water 3 times daily x 1 week  If you are age 26 or older, your body mass index should be between 23-30. Your Body mass index is 43.08 kg/m. If this is out of the aforementioned range listed, please consider follow up with your Primary Care Provider.  If you are age 29 or younger, your body mass index should be between 19-25. Your Body mass index is 43.08 kg/m. If this is out of the aformentioned range listed, please consider follow up with your Primary Care Provider.   __________________________________________________________  The [facility] providers would like to encourage you to use [facility] to communicate with providers for non-urgent requests or questions.  Due to long hold times on the telephone, sending your provider a message by [facility] may be a faster and more efficient way to get a response.  Please allow 48 business hours for a response.  Please remember that this is for non-urgent requests.   Due to recent changes in healthcare laws, you may see the results of your imaging and laboratory studies on MyChart before your provider has had a chance to review them.  We understand that in some cases there may be results that are confusing or concerning to you. Not all laboratory results come back in the same time frame and the provider may be waiting for multiple results in order to interpret others.  Please give [place] 48 hours in order for your provider to thoroughly review all the results before  contacting the office for clarification of your results.   It was a pleasure to see you today!  Thank you for trusting me with your gastrointestinal care!

## 2021-05-19 NOTE — Progress Notes (Signed)
Gastroenterology Consult Note:  History: Yvette Hart 05/19/2021  Referring provider: Coralee Rud, PA-C  Reason for consult/chief complaint: Cough (Chronic cough, for about 6 years.), Gastroesophageal Reflux (Reflux with burning, patient states the past week has been awful), dark stools (Has had dark tarry stools for about 5 days, seems to be getting better in the past 2 days. ), and Abdominal Pain (Lower abd pain that feels like contractions)   Subjective  HPI:  Yvette Hart is a 30 year old woman referred by primary care at the urging of her employer (a local urologist) when she developed some acute GI symptoms last week.  She has had at least 6 years of a chronic cough bothering her daily, sometimes dry sometimes productive.  She tells me she will cough to the point that she will become nauseated and vomit, and showed me an emesis container she keeps in the pocket of her scrubs.  She has not had any evaluation of this by primary care, ENT or pulmonary that she recalls.  However, last week she was feeling more sinus pressure and congestion than usual and was given azithromycin.  About 2 days later she developed severe heartburn improving little on antacids, as well as severe crampy lower abdominal pain with passage of black stool.  She had not been taking aspirin or Pepto-Bismol.  She had taken Zithromax in the past without any GI symptoms that she recalls.  Her bowel habits are regular, and she did not develop diarrhea or constipation during this episode that began last week.  She tells me the black stool is "clearing up", and the heartburn is still present throughout the day but slowly subsiding.  She was concerned that perhaps this was a flare of an underlying stomach reflux problem that may be related to the cough, she was concerned about ulcer as well.  Actually had some dyspepsia many years ago and was told she had ulcer and given an unknown medicine, but she does not recall  having an endoscopy and does not think she has been tested or treated for H. pylori.   ROS:  Review of Systems  Constitutional:  Positive for fatigue. Negative for appetite change and unexpected weight change.  HENT:  Positive for sore throat. Negative for mouth sores and voice change.   Eyes:  Negative for pain and redness.  Respiratory:  Positive for cough. Negative for shortness of breath.   Cardiovascular:  Negative for chest pain and palpitations.  Genitourinary:  Negative for dysuria and hematuria.  Musculoskeletal:  Positive for arthralgias. Negative for myalgias.  Skin:  Negative for pallor and rash.  Allergic/Immunologic: Positive for environmental allergies.       Sinus congestion  Neurological:  Positive for headaches. Negative for weakness.  Hematological:  Negative for adenopathy.  Psychiatric/Behavioral:         Anxiety    Past Medical History: Past Medical History:  Diagnosis Date   Cardiomyopathy (HCC)    monitored by [facility] [place] pt bringing records   Fatty liver    Hepatic artery stenosis (HCC)    SVD (spontaneous vaginal delivery) 01/26/2018   Yvette Hart says she has a cardiologist with [facility], and that her heart condition is "stable".  No records are presently available about that, and she is planning to get new primary care provider.  Past Surgical History: Past Surgical History:  Procedure Laterality Date   NO PAST SURGERIES       Family History: Family History  Problem Relation Age of Onset   Hypertension Father    Cervical cancer Paternal Aunt    Ovarian cancer Paternal Aunt    Heart disease Paternal Grandmother    Breast cancer Paternal Grandmother    Diabetes Paternal Grandmother    Colon polyps Paternal Grandfather    Colon cancer Paternal Grandfather    Crohn's disease Cousin    Esophageal cancer Neg Hx    Pancreatic cancer Neg Hx    Stomach cancer Neg Hx     Social History: Social History    Socioeconomic History   Marital status: Married    Spouse name: Not on file   Number of children: Not on file   Years of education: Not on file   Highest education level: Not on file  Occupational History   Not on file  Tobacco Use   Smoking status: Never   Smokeless tobacco: Never  Vaping Use   Vaping Use: Never used  Substance and Sexual Activity   Alcohol use: Yes    Comment: social   Drug use: No   Sexual activity: Yes    Birth control/protection: None  Other Topics Concern   Not on file  Social History Narrative   Not on file   Social Determinants of Health   Financial Resource Strain: Not on file  Food Insecurity: Not on file  Transportation Needs: Not on file  Physical Activity: Not on file  Stress: Not on file  Social Connections: Not on file    Allergies: Allergies  Allergen Reactions   Latex Rash   Onion Nausea And Vomiting and Other (See Comments)    Upset stomache    Outpatient Meds: Current Outpatient Medications  Medication Sig Dispense Refill   acetaminophen (TYLENOL) 500 MG tablet Take 500-1,000 mg by mouth every 6 (six) hours as needed for mild pain or headache.     calcium carbonate (TUMS - DOSED IN MG ELEMENTAL CALCIUM) 500 MG chewable tablet Chew 1 tablet by mouth at bedtime.     ondansetron (ZOFRAN) 4 MG tablet ondansetron HCl 4 mg tablet  TAKE 1 TABLET BY MOUTH EVERY 4 HOURS AS NEEDED     pantoprazole (PROTONIX) 40 MG tablet Take 40 mg by mouth 2 (two) times daily.     ranitidine (ZANTAC) 150 MG tablet Take 150 mg by mouth daily as needed for heartburn.     sucralfate (CARAFATE) 1 g tablet Take 2 tablets dissolved in 2 tablespoons water 3 times daily for 1 week. 42 tablet 0   No current facility-administered medications for this visit.      ___________________________________________________________________ Objective   Exam:  BP 114/72 (BP Location: Left Arm, Patient Position: Sitting, Cuff Size: Normal)   Pulse 64   Ht 5\' 4"   (1.626 m)   Wt 251 lb (113.9 kg)   BMI 43.08 kg/m  Wt Readings from Last 3 Encounters:  05/19/21 251 lb (113.9 kg)  01/26/18 254 lb 6.4 oz (115.4 kg)  09/05/17 253 lb 6 oz (114.9 kg)   Exam chaperoned by 13/08/18, CMA General: Well-appearing, pleasant and conversational, no acute distress.  Sounds like she has sinus congestion, normal vocal quality. Eyes: sclera anicteric, no redness ENT: oral mucosa moist without lesions, no cervical or supraclavicular lymphadenopathy CV: RRR without murmur, S1/S2, no JVD, no peripheral edema Resp: clear to auscultation bilaterally, normal RR and effort noted GI: soft, no tenderness, with active bowel sounds. No guarding or palpable organomegaly noted. Skin; warm and dry, no rash or jaundice  noted Neuro: awake, alert and oriented x 3. Normal gross motor function and fluent speech Rectal: Normal external.  No tenderness, fissure or palpable internal lesion.  Soft light brown heme-negative stool. Labs:  CBC Latest Ref Rng & Units 05/16/2021 01/27/2018 01/26/2018  WBC 4.0 - 10.5 K/uL 10.7(H) 11.0(H) 10.5  Hemoglobin 12.0 - 15.0 g/dL 70.2 11.5(L) 14.2  Hematocrit 36.0 - 46.0 % 42.0 32.4(L) 39.7  Platelets 150.0 - 400.0 K/uL 224.0 149(L) 150     Radiologic Studies:  None  Assessment: Encounter Diagnoses  Name Primary?   Lower abdominal pain Yes   Change in bowel habits    Heartburn     Acute onset severe heartburn as well as crampy lower abdominal pain and reported black stool, though does not appear to have been bleeding.  Hemoglobin normal within last few days, heme-negative on exam today.  This may have been side effects of azithromycin even though she had tolerated it before.  Doubtful pill esophagitis with this particular antibiotic and no odynophagia.  Possible acute H. pylori infection.  Plan: H pylori antibody Carafate 1 g dissolved in 2 tablespoons of water 3 times daily. Discontinue ranitidine and slowly decrease pantoprazole over  next week. Will contact patient with lab results and get symptom update at that time. I have strongly encouraged her to seek pulmonary consultation.  If she is between primary care providers and if she can determine that [facility] practices are in her insurance network, we will be glad to refer her to [facility].  Chronic cough sounds more likely to be sinus trouble and perhaps cough variant asthma than from GERD.  Thank you for the courtesy of this consult.  Please call me with any questions or concerns.  Charlie Pitter III  CC: Referring provider noted above

## 2021-05-31 ENCOUNTER — Inpatient Hospital Stay
Admit: 2021-05-31 | Discharge: 2021-05-31 | Disposition: A | Discharge status: Home / Self Care | Payer: PRIVATE HEALTH INSURANCE | Attending: Emergency Medicine

## 2021-05-31 DIAGNOSIS — K146 Glossodynia: Secondary | ICD-10-CM

## 2021-05-31 MED ORDER — DEXAMETHASONE 2 MG PO TABS
2 MG | Freq: Once | ORAL | Status: AC
Start: 2021-05-31 — End: 2021-05-31
  Administered 2021-05-31: 12:00:00 10 mg via ORAL

## 2021-05-31 MED ORDER — IBUPROFEN 800 MG PO TABS
800 MG | ORAL_TABLET | Freq: Two times a day (BID) | ORAL | 1 refills | Status: DC | PRN
Start: 2021-05-31 — End: 2021-07-01

## 2021-05-31 MED ORDER — DIPHENHYDRAMINE HCL 25 MG PO CAPS
25 MG | ORAL_CAPSULE | Freq: Four times a day (QID) | ORAL | 0 refills | Status: AC | PRN
Start: 2021-05-31 — End: 2021-06-05

## 2021-05-31 MED ORDER — DEXAMETHASONE 4 MG PO TABS
4 MG | ORAL_TABLET | Freq: Every day | ORAL | 0 refills | Status: AC
Start: 2021-05-31 — End: 2021-06-10

## 2021-05-31 MED ORDER — IBUPROFEN 100 MG/5ML PO SUSP
100 MG/5ML | Freq: Once | ORAL | Status: AC
Start: 2021-05-31 — End: 2021-05-31
  Administered 2021-05-31: 12:00:00 600 mg via ORAL

## 2021-05-31 MED FILL — IBUPROFEN CHILDRENS 100 MG/5ML PO SUSP: 100 MG/5ML | ORAL | Qty: 30

## 2021-05-31 MED FILL — DEXAMETHASONE 2 MG PO TABS: 2 mg | ORAL | Qty: 1

## 2021-05-31 NOTE — ED Provider Notes (Signed)
EMERGENCY DEPARTMENT ENCOUNTER    Pt Name: Kara Freeman  MRN: [id number]  Birthdate [birth year]  Date of evaluation: 05/31/21  CHIEF COMPLAINT       Chief Complaint   Patient presents with    Other     Swollen/sore tongue, states left side of tongue, onset this am     HISTORY OF PRESENT ILLNESS   HPI  Patient is a 30 year old female who presented to the emergency department secondary to tongue pain.  Patient complains of same day history of pain localized to the left side of her tongue.  She woke up this morning on her tongue to be painful.  She denied any trauma or injury.  She denied eating extremely hot or cold foods.  She denied difficulty swallowing or sore throat.  Where she works years, keep ox out bright she denied any lesions or rash on her scan.  Patient denied difficulty eating or speaking.  Patient lotions, detergent, medications or foods.  She does not take any blood pressure medications.  Patient denies chest pain, shortness of breath, nausea, vomiting, fevers or chills.    REVIEW OF SYSTEMS     Review of Systems   Constitutional:  Negative for chills, diaphoresis and fever.   HENT:  Negative for congestion, ear pain and facial swelling.         Tongue pain   Eyes:  Negative for pain, discharge and visual disturbance.   Respiratory:  Negative for chest tightness and shortness of breath.    Cardiovascular:  Negative for chest pain and palpitations.   Gastrointestinal:  Negative for abdominal distention and abdominal pain.   Genitourinary:  Negative for difficulty urinating and flank pain.   Musculoskeletal:  Negative for back pain.   Skin:  Negative for wound.   Neurological:  Negative for dizziness, light-headedness and headaches.   PASTMEDICAL HISTORY     Past Medical History:   Diagnosis Date    Herpes     Herpes      SURGICAL HISTORY       Past Surgical History:   Procedure Laterality Date    ABDOMINOPLASTY      CESAREAN SECTION      TUBAL LIGATION       CURRENT MEDICATIONS       Previous Medications     FERROUS SULFATE 325 (65 FE) MG EC TABLET    Take by mouth     ALLERGIES     has No Known Allergies.  FAMILY HISTORY     has no family status information on file.      SOCIAL HISTORY       Social History     Tobacco Use    Smoking status: Never    Smokeless tobacco: Never   Substance Use Topics    Alcohol use: No    Drug use: No     PHYSICAL EXAM     INITIAL VITALS: BP 123/83    Pulse 81    Temp 98.4 ??F (36.9 ??C) (Oral)    Resp 16    Ht 5\' 3"  (1.6 m)    Wt 210 lb (95.3 kg)    LMP 11/22/2017    SpO2 98%    BMI 37.20 kg/m??    Physical Exam  Vitals and nursing note reviewed.   Constitutional:       General: She is not in acute distress.     Appearance: She is well-developed. She is not diaphoretic.   HENT:  Head: Normocephalic and atraumatic.      Comments: Oral cavity: Uvula midline nonedematous no sublingual edema, mild swelling to the left lateral side of the home with erythema to the taste buds.  No gingival edema or abscess appreciated.  Eyes:      Pupils: Pupils are equal, round, and reactive to light.   Cardiovascular:      Rate and Rhythm: Normal rate and regular rhythm.   Pulmonary:      Effort: Pulmonary effort is normal.      Breath sounds: Normal breath sounds.   Abdominal:      General: Bowel sounds are normal.      Palpations: Abdomen is soft.   Musculoskeletal:         General: Normal range of motion.      Cervical back: Normal range of motion and neck supple.   Skin:     General: Skin is warm.      Capillary Refill: Capillary refill takes less than 2 seconds.   Neurological:      Mental Status: She is alert and oriented to person, place, and time.       MEDICAL DECISION MAKING:   The patient is a 30 year old female who presented to the emergency department secondary to tongue pain.  No sublingual edema appreciated or evidence of strep or deep space infection.  Patient list reviewed patient is no on any ACE inhibitor.  Patient received Motrin and Decadron in the emergency department.  Patient declined  testing for, keep ox she is discharged home with a prescription for ibuprofen, Decadron and Benadryl, given outpatient follow-up and parameters to return to the emergency         All patient's question's and concerns were answered prior to disposition and patient and/or family expressed understanding and agreement of treatment plan.        CRITICAL CARE:              NIH STROKE SCALE:            PROCEDURES:    Procedures    DIAGNOSTIC RESULTS   EKG:All EKG's are interpreted by the Emergency Department Physician who either signs or Co-signs this chart in the absence of a cardiologist.        RADIOLOGY:All plain film, CT, MRI, and formal ultrasound images (except ED bedside ultrasound) are read by the radiologist, see reports below, unless otherwisenoted in MDM or here.  No orders to display     LABS: All lab results were reviewed by myself, and all abnormals are listed below.  Labs Reviewed - No data to display    EMERGENCY DEPARTMENTCOURSE:         Vitals:    Vitals:    05/31/21 0739   BP: 123/83   Pulse: 81   Resp: 16   Temp: 98.4 ??F (36.9 ??C)   TempSrc: Oral   SpO2: 98%   Weight: 210 lb (95.3 kg)   Height: 5\' 3"  (1.6 m)       The patient was given the following medications while in the emergency department:  Orders Placed This Encounter   Medications    ibuprofen (ADVIL;MOTRIN) 100 MG/5ML suspension 600 mg    dexamethasone (DECADRON) tablet 10 mg    ibuprofen (ADVIL;MOTRIN) 800 MG tablet     Sig: Take 1 tablet by mouth 2 times daily as needed for Pain     Dispense:  25 tablet     Refill:  1    diphenhydrAMINE (  BENADRYL) 25 MG capsule     Sig: Take 1 capsule by mouth every 6 hours as needed for Itching     Dispense:  20 capsule     Refill:  0    dexamethasone (DECADRON) 4 MG tablet     Sig: Take 1 tablet by mouth in the morning for 10 days.     Dispense:  6 tablet     Refill:  0     CONSULTS:  None    FINAL IMPRESSION      1. Tongue pain    2. Stomatitis          DISPOSITION/PLAN   DISPOSITION Decision To Discharge  05/31/2021 08:33:46 AM      PATIENT REFERRED TO:    Please call [phone] to establish care with a primary care physician.      DISCHARGE MEDICATIONS:  New Prescriptions    DEXAMETHASONE (DECADRON) 4 MG TABLET    Take 1 tablet by mouth in the morning for 10 days.    DIPHENHYDRAMINE (BENADRYL) 25 MG CAPSULE    Take 1 capsule by mouth every 6 hours as needed for Itching    IBUPROFEN (ADVIL;MOTRIN) 800 MG TABLET    Take 1 tablet by mouth 2 times daily as needed for Pain     Ethelene Browns, MD  Attending Emergency Physician      The care is provided during an unprecedented national emergency due to the novel coronavirus, COVID 19.    This note was created with the assistance of a speech-recognition program. While intending to generate a document that actually reflects the content of the visit, no guarantees can be provided that every mistake has been identified and corrected by editing.    Ethelene Browns, MD  05/31/21 [phone]

## 2021-07-01 ENCOUNTER — Inpatient Hospital Stay
Admit: 2021-07-01 | Discharge: 2021-07-02 | Disposition: A | Discharge status: Home / Self Care | Payer: PRIVATE HEALTH INSURANCE | Attending: Emergency Medicine

## 2021-07-01 DIAGNOSIS — N3001 Acute cystitis with hematuria: Secondary | ICD-10-CM

## 2021-07-01 NOTE — ED Provider Notes (Signed)
[facility]  Emergency Department Encounter     Pt Name: Kara Freeman  MRN: [id number]  Birthdate 10-Jul-1991  Date of evaluation: 07/01/21  PCP:  Maree Erie, MD    CHIEF COMPLAINT       Chief Complaint   Patient presents with    Urinary Tract Infection     Seen at urgent care 4 days ago, prescribed Cipro, has been taking it with no relief       HISTORY OFPRESENT ILLNESS  (Location/Symptom, Timing/Onset, Context/Setting, Quality, Duration, Modifying Factors,Severity.)      Kara Freeman is a 30 y.o. female who presents with dysuria, increased frequency, burning and pain.  Denies any rashes.  Seen in urgent care few days ago.  Started on Cipro.  She states she has been taking as prescribed and symptoms have not been improved.  They were improved when she was on Pyridium.  She does endorse that she has had multiple previous urine infections prior to hysterectomy and was on multiple different antibiotics.  Pain is intermittent.  Severe.  Worsening.  Burning.    PAST MEDICAL / SURGICAL / SOCIAL / FAMILY HISTORY      has a past medical history of Herpes and Herpes.     has a past surgical history that includes Cesarean section; Tubal ligation; and Abdominoplasty.    Social History     Socioeconomic History    Marital status: Single     Spouse name: Not on file    Number of children: Not on file    Years of education: Not on file    Highest education level: Not on file   Occupational History    Not on file   Tobacco Use    Smoking status: Never    Smokeless tobacco: Never   Substance and Sexual Activity    Alcohol use: No    Drug use: No    Sexual activity: Not on file   Other Topics Concern    Not on file   Social History Narrative    Not on file     Social Determinants of Health     Financial Resource Strain: Not on file   Food Insecurity: Not on file   Transportation Needs: Not on file   Physical Activity: Not on file   Stress: Not on file   Social Connections: Not on file   Intimate Partner Violence:  Not on file   Housing Stability: Not on file       History reviewed. No pertinent family history.    Allergies:  Bactrim [sulfamethoxazole-trimethoprim]    Home Medications:  Prior to Admission medications    Medication Sig Start Date End Date Taking? Authorizing Provider   cephALEXin (KEFLEX) 500 MG capsule Take 1 capsule by mouth 2 times daily for 7 days 07/01/21 07/08/21 Yes Despina Hidden, DO   fluconazole (DIFLUCAN) 150 MG tablet Take 1 tablet by mouth once for 1 dose 07/08/21 07/08/21 Yes Despina Hidden, DO       REVIEW OF SYSTEMS    (2-9 systems for level 4, 10 or more for level 5)      Review of Systems   Constitutional:  Negative for chills and fever.   Eyes:  Negative for discharge and redness.   Respiratory:  Negative for shortness of breath.    Cardiovascular:  Negative for chest pain.   Gastrointestinal:  Negative for abdominal pain.   Genitourinary:  Positive for dysuria, frequency and urgency. Negative for genital  sores.   Musculoskeletal:  Negative for myalgias.   Skin:  Negative for color change and rash.   Allergic/Immunologic: Negative for environmental allergies.   Neurological:  Negative for headaches.   Psychiatric/Behavioral:  Negative for agitation and confusion.      PHYSICAL EXAM   (up to 7 for level 4, 8 or more for level 5)     INITIAL VITALS:    height is 5\' 3"  (1.6 m) and weight is 210 lb (95.3 kg). Her oral temperature is 98.1 ??F (36.7 ??C). Her blood pressure is 116/83 and her pulse is 96. Her respiration is 16 and oxygen saturation is 97%.     Physical Exam  Vitals and nursing note reviewed.   Constitutional:       General: She is not in acute distress.     Appearance: She is well-developed. She is not toxic-appearing.   HENT:      Head: Normocephalic and atraumatic.      Nose: Nose normal.      Mouth/Throat:      Mouth: Mucous membranes are moist.   Eyes:      General: No scleral icterus.     Conjunctiva/sclera: Conjunctivae normal.      Pupils: Pupils are equal, round, and reactive  to light.   Cardiovascular:      Rate and Rhythm: Normal rate and regular rhythm.      Heart sounds: Normal heart sounds. No murmur heard.    No friction rub. No gallop.   Pulmonary:      Effort: Pulmonary effort is normal. No respiratory distress.      Breath sounds: Normal breath sounds. No wheezing or rales.   Abdominal:      Palpations: Abdomen is soft.      Tenderness: There is no abdominal tenderness.   Musculoskeletal:         General: Normal range of motion.   Skin:     General: Skin is warm and dry.      Findings: No erythema or rash.   Neurological:      Mental Status: She is alert and oriented to person, place, and time.   Psychiatric:         Behavior: Behavior normal.       DIFFERENTIAL  DIAGNOSIS     PLAN (LABS / IMAGING / EKG):  Orders Placed This Encounter   Procedures    Culture, Urine    Urinalysis with Reflex to Culture    Microscopic Urinalysis       MEDICATIONS ORDERED:  Orders Placed This Encounter   Medications    cephALEXin (KEFLEX) capsule 500 mg     Order Specific Question:   Antimicrobial Indications     Answer:   Urinary Tract Infection    cephALEXin (KEFLEX) 500 MG capsule     Sig: Take 1 capsule by mouth 2 times daily for 7 days     Dispense:  14 capsule     Refill:  0    fluconazole (DIFLUCAN) 150 MG tablet     Sig: Take 1 tablet by mouth once for 1 dose     Dispense:  1 tablet     Refill:  0       DDX: UTI versus antibiotic resistance versus pyelonephritis versus ureterolithiasis    Initial MDM/Plan: 30 y.o. female who presents with typical urinary tract infection symptoms.  Suspect patient has UTI resistant to Cipro.  Has been on multiple antibiotics previously given high local rate  of ciprofloxacin resistance will start on Keflex.  Reassess urine.  Sent for culture.  Otherwise well-appearing, nontoxic, no abdominal pain, no nausea no vomiting.  Low Suspicion for pyelonephritis or occult kidney stone.    DIAGNOSTIC RESULTS / EMERGENCY DEPARTMENT COURSE / MDM     LABS:  Labs Reviewed    URINALYSIS WITH REFLEX TO CULTURE - Abnormal; Notable for the following components:       Result Value    Turbidity UA SLIGHTLY CLOUDY (*)     Specific Gravity, UA 1.039 (*)     Urine Hgb 2+ (*)     Leukocyte Esterase, Urine TRACE (*)     All other components within normal limits   MICROSCOPIC URINALYSIS - Abnormal; Notable for the following components:    Crystals, UA 0 TO 2 CALCIUM OXALATE (*)     Bacteria, UA MODERATE (*)     Mucus, UA 1+ (*)     All other components within normal limits   CULTURE, URINE         RADIOLOGY:  No results found.    EEMERGENCY DEPARTMENT COURSE:  Did have bacteriuria, does have some microscopic hematuria.  No gross blood.  Given lack of any abdominal pain or back pain low suspicion for occult kidney stone.  We will switch to Keflex.  Send urine for culture.  Follow-up with primary care doctor.  Also given dose of fluconazole to take after antibiotics.      Based on the low acuity of concerning symptoms and improvement of symptoms, patient will be discharged with follow up and prescription information listed in the Disposition section.  Patient states they will follow-up with primary care physician and/or return to the emergency department should they experience a change or worsening of symptoms.  Patient will be discharged with resources: summary of visit, instructions, follow-up information, prescriptions if necessary.  Patient/ family instructed to read discharge paperwork. All of their questions and concerns were addressed.   Suspicion for any acute life-threatening processes is low.   Patient voices understanding of plan.        PROCEDURES:  None    CONSULTS:  None    CRITICAL CARE:  0    FINAL IMPRESSION      1. Acute cystitis with hematuria          DISPOSITION / PLAN     DISPOSITION Decision To Discharge 07/01/2021 08:44:45 PM    Discharge    PATIENTREFERRED TO:  Maree Erie, MD  [address]  [place]  [place] [address]  [phone]    Schedule an appointment as soon as  possible for a visit in 1 week  As needed    DISCHARGE MEDICATIONS:  Discharge Medication List as of 07/01/2021  8:45 PM        START taking these medications    Details   cephALEXin (KEFLEX) 500 MG capsule Take 1 capsule by mouth 2 times daily for 7 days, Disp-14 capsule, R-0Print             Despina Hidden, DO  EmergencyMedicine Attending    (Please note that portions of this note were completed with a voice recognition program.  Efforts were made to edit the dictations but occasionally words are mis-transcribed.)       Despina Hidden, DO  07/01/21 2114

## 2021-07-01 NOTE — ED Notes (Signed)
Pt to ED with c/o dysuria, and swollen & tender labia. Pt ws seen at pcp 4 days ago for blood in her urine, states symptoms worsened today. Pt has been taking cipro 2x a day for the past four days as prescribed, states she has not missed any doses. Pt states wiping after urination is painful, and it causes her pain to urinate. Pts urine appeared yellow and cloudy.      Krystal Clark, RN  07/01/21 2009

## 2021-07-01 NOTE — Discharge Instructions (Addendum)
Please take antibiotic to completion even if your symptoms improve.  Follow-up with your primary care doctor.    Take your medication as indicated and prescribed.  If you are given an antibiotic then, make sure you get the prescription filled and take the antibiotics until finished.  Drink plenty of water while taking the antibiotics.  Avoid drinking alcohol or drinks that have caffeine in it while taking antibiotics.   If you were given the medication pyridium (or take over the counter Azo) - do not wear any contacts for the next week since this medication will turn your tears an orange color and will stain the contacts.      For pain use acetaminophen (Tylenol) or ibuprofen (Motrin / Advil), unless prescribed medications that have acetaminophen or ibuprofen (or similar medications) in it.  You can take over the counter acetaminophen tablets (1 - 2 tablets of the 500-mg strength every 6 hours) or ibuprofen tablets (2 tablets every 4 hours).    PLEASE RETURN TO THE EMERGENCY DEPARTMENT IMMEDIATELY for worsening symptoms, inability to urinate, worsening of blood in your urine, or if you develop any concerning symptoms such as: high fever not relieved by acetaminophen (Tylenol) and/or ibuprofen (Motrin / Advil), chills, shortness of breath, chest pain, feeling of your heart fluttering or racing, persistent nausea and/or vomiting, vomiting up blood, blood in your stool, loss of consciousness, numbness, weakness or tingling in the arms or legs or change in color of the extremities, changes in mental status, persistent headache, blurry vision, loss of bladder / bowel.

## 2021-07-02 LAB — URINALYSIS WITH REFLEX TO CULTURE
Bilirubin Urine: NEGATIVE
Glucose, Ur: NEGATIVE
Ketones, Urine: NEGATIVE
Nitrite, Urine: NEGATIVE
Protein, UA: NEGATIVE
Specific Gravity, UA: 1.039 — ABNORMAL HIGH (ref 1.005–1.030)
Urobilinogen, Urine: NORMAL
pH, UA: 5.5 (ref 5.0–8.0)

## 2021-07-02 LAB — MICROSCOPIC URINALYSIS
Crystals, UA: 0 /HPF — AB
Epithelial Cells UA: 50 /HPF (ref 0–5)
RBC, UA: 5 /HPF (ref 0–2)
WBC, UA: 2 /HPF (ref 0–5)

## 2021-07-02 MED ORDER — CEPHALEXIN 500 MG PO CAPS
500 MG | ORAL_CAPSULE | Freq: Two times a day (BID) | ORAL | 0 refills | Status: AC
Start: 2021-07-02 — End: 2021-07-08

## 2021-07-02 MED ORDER — FLUCONAZOLE 150 MG PO TABS
150 MG | ORAL_TABLET | Freq: Once | ORAL | 0 refills | Status: AC
Start: 2021-07-02 — End: 2021-07-08

## 2021-07-02 MED ORDER — CEPHALEXIN 500 MG PO CAPS
500 MG | Freq: Once | ORAL | Status: AC
Start: 2021-07-02 — End: 2021-07-01
  Administered 2021-07-02: 01:00:00 500 mg via ORAL

## 2021-07-02 MED FILL — CEPHALEXIN 500 MG PO CAPS: 500 mg | ORAL | Qty: 1

## 2021-07-03 LAB — CULTURE, URINE: Culture: NO GROWTH

## 2022-05-16 ENCOUNTER — Other Ambulatory Visit: Payer: Self-pay | Admitting: Adult Health

## 2022-05-16 ENCOUNTER — Encounter: Payer: Self-pay | Admitting: Adult Health

## 2022-05-16 ENCOUNTER — Ambulatory Visit (INDEPENDENT_AMBULATORY_CARE_PROVIDER_SITE_OTHER): Payer: No Typology Code available for payment source | Admitting: Adult Health

## 2022-05-16 VITALS — BP 102/70 | HR 61 | Temp 98.5°F | Ht 65.0 in | Wt 251.0 lb

## 2022-05-16 DIAGNOSIS — Z0000 Encounter for general adult medical examination without abnormal findings: Secondary | ICD-10-CM

## 2022-05-16 DIAGNOSIS — I429 Cardiomyopathy, unspecified: Secondary | ICD-10-CM

## 2022-05-16 LAB — CBC WITH DIFFERENTIAL/PLATELET
Basophils Absolute: 0 10*3/uL (ref 0.0–0.1)
Basophils Relative: 0.5 % (ref 0.0–3.0)
Eosinophils Absolute: 0.1 10*3/uL (ref 0.0–0.7)
Eosinophils Relative: 1.1 % (ref 0.0–5.0)
HCT: 45 % (ref 36.0–46.0)
Hemoglobin: 15 g/dL (ref 12.0–15.0)
Lymphocytes Relative: 40.8 % (ref 12.0–46.0)
Lymphs Abs: 2.6 10*3/uL (ref 0.7–4.0)
MCHC: 33.3 g/dL (ref 30.0–36.0)
MCV: 88.4 fl (ref 78.0–100.0)
Monocytes Absolute: 0.4 10*3/uL (ref 0.1–1.0)
Monocytes Relative: 5.8 % (ref 3.0–12.0)
Neutro Abs: 3.4 10*3/uL (ref 1.4–7.7)
Neutrophils Relative %: 51.8 % (ref 43.0–77.0)
Platelets: 248 10*3/uL (ref 150.0–400.0)
RBC: 5.1 Mil/uL (ref 3.87–5.11)
RDW: 12.9 % (ref 11.5–15.5)
WBC: 6.5 10*3/uL (ref 4.0–10.5)

## 2022-05-16 LAB — LIPID PANEL
Cholesterol: 187 mg/dL (ref 0–200)
HDL: 48.4 mg/dL (ref >39.00)
LDL Cholesterol: 127 mg/dL — ABNORMAL HIGH (ref 0–99)
NonHDL: 138.45
Total CHOL/HDL Ratio: 4
Triglycerides: 59 mg/dL (ref 0.0–149.0)
VLDL: 11.8 mg/dL (ref 0.0–40.0)

## 2022-05-16 LAB — CORTISOL: Cortisol, Plasma: 6.7 ug/dL

## 2022-05-16 LAB — COMPREHENSIVE METABOLIC PANEL
ALT: 23 U/L (ref 0–35)
AST: 25 U/L (ref 0–37)
Albumin: 4.5 g/dL (ref 3.5–5.2)
Alkaline Phosphatase: 59 U/L (ref 39–117)
BUN: 13 mg/dL (ref 6–23)
CO2: 27 mEq/L (ref 19–32)
Calcium: 9.3 mg/dL (ref 8.4–10.5)
Chloride: 104 mEq/L (ref 96–112)
Creatinine, Ser: 0.65 mg/dL (ref 0.40–1.20)
GFR: 115.73 mL/min (ref >60.00)
Glucose, Bld: 87 mg/dL (ref 70–99)
Potassium: 4.7 mEq/L (ref 3.5–5.1)
Sodium: 139 mEq/L (ref 135–145)
Total Bilirubin: 1 mg/dL (ref 0.2–1.2)
Total Protein: 7.2 g/dL (ref 6.0–8.3)

## 2022-05-16 LAB — TSH: TSH: 1.22 u[IU]/mL (ref 0.35–5.50)

## 2022-05-16 LAB — HEMOGLOBIN A1C: Hgb A1c MFr Bld: 5.1 % (ref 4.6–6.5)

## 2022-05-16 MED ORDER — SEMAGLUTIDE-WEIGHT MANAGEMENT 0.25 MG/0.5ML ~~LOC~~ SOAJ: 0.2500 mg | SUBCUTANEOUS | 0 refills | Status: DC

## 2022-05-16 NOTE — Progress Notes (Signed)
Patient presents to clinic today to establish care. She is a pleasant 31 year old female who  has a past medical history of Cardiomyopathy (HCC), Chicken pox, Depression, Fatty liver, GERD (gastroesophageal reflux disease), Hepatic artery stenosis (HCC), SVD (spontaneous vaginal delivery) (01/26/2018), and Urinary incontinence.   Acute Concerns: Establish Care   Chronic Issues: Cardiomyopathy - Was being seen at [facility]. Was diagnosed with Hypertrophic Cardiomyopathy and she was able to lose weight to help with this. She was last seen by Cardiology 4 year ago. She denies chest pain or SOB.   Obesity - has had trouble with weight loss in the past. She has tried the weight loss clinic at [facility]. Has been tried on Topamax, Victoza, and Phentermine with minimal results.  She has not been able to tolerate phentermine as it causes nausea.  Recently she reports losing her mother to lung cancer this year and her father has also gone through treatment for rectal cancer.  She does eat healthy but has not had a lot of time to work out as she is been having to take care of her father as well as be a mother and a wife.  She does plan to start getting back into the gym.  She believes that her obesity is causing chronic joint pain in her neck and hips.  She does not have a family history of obesity.  Wt Readings from Last 3 Encounters:  05/16/22 251 lb (113.9 kg)  05/19/21 251 lb (113.9 kg)  01/26/18 254 lb 6.4 oz (115.4 kg)    Health Maintenance: Dental -- Routine Care Vision -- Routine Care  Immunizations -- UTD  Colonoscopy -- Never had  PAP - up to date, she has GYN     Past Medical History:  Diagnosis Date   Cardiomyopathy (HCC)    monitored by [facility] medical [place] pt bringing records   Chicken pox    Depression    Fatty liver    GERD (gastroesophageal reflux disease)    Hepatic artery stenosis (HCC)    SVD (spontaneous vaginal delivery) 01/26/2018    Urinary incontinence     Past Surgical History:  Procedure Laterality Date   NO PAST SURGERIES      Current Outpatient Medications on File Prior to Visit  Medication Sig Dispense Refill   acetaminophen (TYLENOL) 500 MG tablet Take 500-1,000 mg by mouth every 6 (six) hours as needed for mild pain or headache.     Alpha-Lipoic Acid 200 MG CAPS Take by mouth.     APPLE CIDER VINEGAR PO Take 450 mg by mouth.     calcium carbonate (TUMS - DOSED IN MG ELEMENTAL CALCIUM) 500 MG chewable tablet Chew 1 tablet by mouth at bedtime.     Calcium-Magnesium-Vitamin D (CALCIUM MAGNESIUM PO) Take by mouth.     Cholecalciferol (VITAMIN D) 125 MCG (5000 UT) CAPS Take by mouth.     [employer] EVENING PRIMROSE OIL PO Take by mouth.     folic acid (FOLVITE) 400 MCG tablet Take 400 mcg by mouth daily.     L-Theanine 100 MG CAPS Take by mouth.     Multiple Vitamins-Minerals (MULTIVITAMIN WOMEN) TABS Take by mouth.     ondansetron (ZOFRAN) 4 MG tablet ondansetron HCl 4 mg tablet  TAKE 1 TABLET BY MOUTH EVERY 4 HOURS AS NEEDED     pantoprazole (PROTONIX) 40 MG tablet Take 40 mg by mouth 2 (two) times daily.     Probiotic Product (  FORTIFY PROBIOTIC WOMENS PO) Take by mouth.     ranitidine (ZANTAC) 150 MG tablet Take 150 mg by mouth daily as needed for heartburn.     sucralfate (CARAFATE) 1 g tablet Take 2 tablets dissolved in 2 tablespoons water 3 times daily for 1 week. 42 tablet 0   vitamin E 180 MG (400 UNITS) capsule Take 400 Units by mouth daily.     zinc gluconate 50 MG tablet Take 50 mg by mouth daily.     No current facility-administered medications on file prior to visit.    Allergies  Allergen Reactions   Latex Rash   Onion Nausea And Vomiting and Other (See Comments)    Upset stomache    Family History  Problem Relation Age of Onset   Depression Mother    Miscarriages / [place] Mother        TWICE   Hypertension Father    Mental illness Sister    Depression Sister    Asthma Brother     Depression Brother    Cervical cancer Paternal Aunt    Ovarian cancer Paternal Aunt    Stroke Maternal Grandmother    Heart disease Paternal Grandmother    Breast cancer Paternal Grandmother    Diabetes Paternal Grandmother    COPD Paternal Grandmother    Heart attack Paternal Grandmother    Hypertension Paternal Grandmother    Colon polyps Paternal Grandfather    Colon cancer Paternal Grandfather    Crohn's disease Cousin    Esophageal cancer Neg Hx    Pancreatic cancer Neg Hx    Stomach cancer Neg Hx     Social History   Socioeconomic History   Marital status: Married    Spouse name: Not on file   Number of children: Not on file   Years of education: Not on file   Highest education level: Not on file  Occupational History   Not on file  Tobacco Use   Smoking status: Never   Smokeless tobacco: Never  Vaping Use   Vaping Use: Never used  Substance and Sexual Activity   Alcohol use: Yes    Alcohol/week: 3.0 standard drinks of alcohol    Types: 1 Glasses of wine, 1 Cans of beer, 1 Shots of liquor per week    Comment: social ONCE A MONTH   Drug use: No   Sexual activity: Yes    Birth control/protection: None  Other Topics Concern   Not on file  Social History Narrative   Not on file   Social Determinants of Health   Financial Resource Strain: Not on file  Food Insecurity: Not on file  Transportation Needs: Not on file  Physical Activity: Not on file  Stress: Not on file  Social Connections: Not on file  Intimate Partner Violence: Not on file    Review of Systems  Constitutional: Negative.   HENT: Negative.    Eyes: Negative.   Respiratory: Negative.    Cardiovascular: Negative.   Gastrointestinal: Negative.   Genitourinary: Negative.   Musculoskeletal:  Positive for joint pain and neck pain.  Skin: Negative.   Neurological: Negative.   Endo/Heme/Allergies: Negative.   Psychiatric/Behavioral: Negative.    All other systems reviewed and are  negative.   BP 102/70   Pulse 61   Temp 98.5 F (36.9 C) (Oral)   Ht 5\' 5"  (1.651 m)   Wt 251 lb (113.9 kg)   SpO2 97%   BMI 41.77 kg/m   Physical Exam Vitals and  nursing note reviewed.  Constitutional:      General: She is not in acute distress.    Appearance: Normal appearance. She is well-developed. She is obese. She is not ill-appearing.  HENT:     Head: Normocephalic and atraumatic.     Right Ear: Tympanic membrane, ear canal and external ear normal. There is no impacted cerumen.     Left Ear: Tympanic membrane, ear canal and external ear normal. There is no impacted cerumen.     Nose: Nose normal. No congestion or rhinorrhea.     Mouth/Throat:     Mouth: Mucous membranes are moist.     Pharynx: Oropharynx is clear. No oropharyngeal exudate or posterior oropharyngeal erythema.  Eyes:     General:        Right eye: No discharge.        Left eye: No discharge.     Extraocular Movements: Extraocular movements intact.     Conjunctiva/sclera: Conjunctivae normal.     Pupils: Pupils are equal, round, and reactive to light.  Neck:     Thyroid: No thyromegaly.     Vascular: No carotid bruit.     Trachea: No tracheal deviation.  Cardiovascular:     Rate and Rhythm: Normal rate and regular rhythm.     Pulses: Normal pulses.     Heart sounds: Normal heart sounds. No murmur heard.    No friction rub. No gallop.  Pulmonary:     Effort: Pulmonary effort is normal. No respiratory distress.     Breath sounds: Normal breath sounds. No stridor. No wheezing, rhonchi or rales.  Chest:     Chest wall: No tenderness.  Abdominal:     General: Abdomen is flat. Bowel sounds are normal. There is no distension.     Palpations: Abdomen is soft. There is no mass.     Tenderness: There is no abdominal tenderness. There is no right CVA tenderness, left CVA tenderness, guarding or rebound.     Hernia: No hernia is present.  Musculoskeletal:        General: No swelling, tenderness, deformity  or signs of injury. Normal range of motion.     Cervical back: Normal range of motion and neck supple.     Right lower leg: No edema.     Left lower leg: No edema.  Lymphadenopathy:     Cervical: No cervical adenopathy.  Skin:    General: Skin is warm and dry.     Coloration: Skin is not jaundiced or pale.     Findings: No bruising, erythema, lesion or rash.  Neurological:     General: No focal deficit present.     Mental Status: She is alert and oriented to person, place, and time.     Cranial Nerves: No cranial nerve deficit.     Sensory: No sensory deficit.     Motor: No weakness.     Coordination: Coordination normal.     Gait: Gait normal.     Deep Tendon Reflexes: Reflexes normal.  Psychiatric:        Mood and Affect: Mood normal.        Behavior: Behavior normal.        Thought Content: Thought content normal.        Judgment: Judgment normal.       Assessment/Plan: 1. Routine general medical examination at a health care facility - Follow up in one year or sooner if needed - CBC with Differential/Platelet; Future - Comprehensive metabolic panel;  Future - Hemoglobin A1c; Future - TSH; Future - Cortisol; Future - Lipid panel; Future  2. Class 3 obesity (HCC) - Will check labs. Continue with heart healthy diet and start exercising.  - Consider Wegovy  - CBC with Differential/Platelet; Future - Comprehensive metabolic panel; Future - Hemoglobin A1c; Future - TSH; Future - Cortisol; Future - Lipid panel; Future  3. Cardiomyopathy, unspecified type (HCC) - Will get records from [facility]    Shirline Frees, NP

## 2022-05-16 NOTE — Patient Instructions (Signed)
It was great seeing you today   We will follow up with you regarding your lab work   Please let me know if you need anything   

## 2022-05-22 ENCOUNTER — Encounter: Payer: Self-pay | Admitting: Adult Health

## 2022-06-04 ENCOUNTER — Encounter: Payer: Self-pay | Admitting: Adult Health

## 2022-06-05 NOTE — Telephone Encounter (Signed)
Please advise 

## 2022-06-06 ENCOUNTER — Other Ambulatory Visit: Payer: Self-pay | Admitting: Adult Health

## 2022-07-25 ENCOUNTER — Ambulatory Visit (INDEPENDENT_AMBULATORY_CARE_PROVIDER_SITE_OTHER): Payer: No Typology Code available for payment source | Admitting: Internal Medicine

## 2022-07-25 ENCOUNTER — Encounter (INDEPENDENT_AMBULATORY_CARE_PROVIDER_SITE_OTHER): Payer: Self-pay | Admitting: Internal Medicine

## 2022-07-25 VITALS — BP 121/78 | HR 72 | Temp 98.7°F | Ht 65.0 in | Wt 249.0 lb

## 2022-07-25 DIAGNOSIS — Z6841 Body Mass Index (BMI) 40.0 and over, adult: Secondary | ICD-10-CM

## 2022-07-25 DIAGNOSIS — Z0289 Encounter for other administrative examinations: Secondary | ICD-10-CM

## 2022-07-25 DIAGNOSIS — R5383 Other fatigue: Secondary | ICD-10-CM

## 2022-07-26 NOTE — Progress Notes (Signed)
Office: [phone]  /  Fax: [phone]  Initial Visit  Yvette Hart was seen in clinic today to evaluate for obesity. She is interested in losing weight to improve overall health and reduce the risk of weight related complications. 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  She started gaining weight in her 47s. Has a 4 year son with autism. Husband works long hours. Endorses high levels of stress due to life demands. Enjoys music and her long commutes to work. Helps her relax.  She feels anxious about her weight specially when shopping for clothes and going to grocery store as she is afraid about making unhealthy choices. She had been to [facility] was prescribed GLP1 but was not covered. She felt meal plan was to restrictive . She reports eating healthier now and drinking more water. No physical activity due to limited time.   She presents today to review program treatment options, initial physical assessment, and evaluation.      Past medical history includes:   Past Medical History:  Diagnosis Date   Cardiomyopathy (HCC)    monitored by [facility] medical [place] pt bringing records   Chicken pox    Depression    Fatty liver    GERD (gastroesophageal reflux disease)    Hepatic artery stenosis (HCC)    SVD (spontaneous vaginal delivery) 01/26/2018   Urinary incontinence      Objective:   BP 121/78   Pulse 72   Temp 98.7 F (37.1 C)   Ht 5\' 5"  (1.651 m)   Wt 249 lb (112.9 kg)   SpO2 98%   BMI 41.44 kg/m  She was weighed on the bioimpedance scale:  Body mass index is 41.44 kg/m.  General:  Alert, oriented and cooperative. Patient is in no acute distress.  Respiratory: Normal respiratory effort, no problems with respiration noted  Extremities: Normal range of motion.    Mental Status: Normal mood and affect. Normal behavior. Normal judgment and thought content.   Assessment and Plan:  1. Class 3 severe obesity without serious  comorbidity with body mass index (BMI) of 40.0 to 44.9 in adult, unspecified obesity type (HCC)  2. Other fatigue - EKG 12-Lead      Obesity Treatment Plan:  She will work on garnering support from family and friends to begin weight loss journey. Work on eliminating or reducing the presence of highly processed, calorie dense foods in the home. Complete provided nutritional and psychosocial assessment questionnaire.    will follow up in the next 1-2 weeks to review the above steps and continue evaluation and treatment.  Obesity Education Performed Today:  She was weighed on the bioimpedance scale and results were discussed and documented in the synopsis.  We discussed obesity as a disease and the importance of a more detailed evaluation of all the factors contributing to the disease.  We discussed the importance of long term lifestyle changes which include nutrition, exercise and behavioral modifications as well as the importance of customizing this to her specific health and social needs.  We discussed the benefits of reaching a healthier weight to alleviate the symptoms of existing conditions and reduce the risks of the biomechanical, metabolic and psychological effects of obesity.  We discussed the goals of this program is to improve her overall health and not simply achieve a specific BMI.  Frequent visits are very important to patient success. I plan to see her every 2 weeks for  the first 3 months and then evaluate the visit frequency after that time. I explained obesity is a life-long chronic disease and long term treatments would be required. Medications to help her follow his eating plan may be offered as appropriate but are not required. All medication decisions will be made together after the initial workup is done and benefits and side effects are discussed in depth.  The clinic rules were reviewed including the late policy, cancellation policy, no show and program  fees.  Cristobal Goldmann appears to be in the action stage of change and states they are ready to start intensive lifestyle modifications and behavioral modifications.  30 minutes was spent today on this visit including the above counseling, pre-visit chart review, and post-visit documentation.  Thomes Dinning, MD

## 2022-08-03 MED ORDER — CEFTRIAXONE SODIUM 1 GM IJ SOLR
1 | INTRAMUSCULAR | Status: DC
Start: 2022-08-03 — End: 2022-08-04

## 2022-08-08 ENCOUNTER — Encounter (INDEPENDENT_AMBULATORY_CARE_PROVIDER_SITE_OTHER): Payer: Self-pay | Admitting: Internal Medicine

## 2022-08-08 ENCOUNTER — Ambulatory Visit (INDEPENDENT_AMBULATORY_CARE_PROVIDER_SITE_OTHER): Payer: No Typology Code available for payment source | Admitting: Internal Medicine

## 2022-08-08 VITALS — BP 114/7 | HR 55 | Temp 98.3°F | Ht 65.0 in | Wt 249.0 lb

## 2022-08-08 DIAGNOSIS — E7849 Other hyperlipidemia: Secondary | ICD-10-CM | POA: Diagnosis not present

## 2022-08-08 DIAGNOSIS — Z1331 Encounter for screening for depression: Secondary | ICD-10-CM | POA: Diagnosis not present

## 2022-08-08 DIAGNOSIS — K760 Fatty (change of) liver, not elsewhere classified: Secondary | ICD-10-CM | POA: Diagnosis not present

## 2022-08-08 DIAGNOSIS — R5383 Other fatigue: Secondary | ICD-10-CM

## 2022-08-08 DIAGNOSIS — R0602 Shortness of breath: Secondary | ICD-10-CM

## 2022-08-08 DIAGNOSIS — R1013 Epigastric pain: Secondary | ICD-10-CM

## 2022-08-08 DIAGNOSIS — E669 Obesity, unspecified: Secondary | ICD-10-CM

## 2022-08-08 DIAGNOSIS — R638 Other symptoms and signs concerning food and fluid intake: Secondary | ICD-10-CM | POA: Diagnosis not present

## 2022-08-08 DIAGNOSIS — Z6841 Body Mass Index (BMI) 40.0 and over, adult: Secondary | ICD-10-CM

## 2022-08-09 DIAGNOSIS — R638 Other symptoms and signs concerning food and fluid intake: Secondary | ICD-10-CM | POA: Insufficient documentation

## 2022-08-09 DIAGNOSIS — E7849 Other hyperlipidemia: Secondary | ICD-10-CM | POA: Insufficient documentation

## 2022-08-09 DIAGNOSIS — R1013 Epigastric pain: Secondary | ICD-10-CM | POA: Insufficient documentation

## 2022-08-09 DIAGNOSIS — K760 Fatty (change of) liver, not elsewhere classified: Secondary | ICD-10-CM | POA: Insufficient documentation

## 2022-08-09 LAB — INSULIN, RANDOM: INSULIN: 10.3 u[IU]/mL (ref 2.6–24.9)

## 2022-08-15 NOTE — Progress Notes (Signed)
Chief Complaint:   OBESITY Yvette Hart (MR# [id number]) is a 31 y.o. female who presents for evaluation and treatment of obesity and related comorbidities. Current BMI is Body mass index is 41.44 kg/m. Yvette Hart has been struggling with her weight for many years and has been unsuccessful in either losing weight, maintaining weight loss, or reaching her healthy weight goal.  Yvette Hart is currently in the action stage of change and ready to dedicate time achieving and maintaining a healthier weight. Yvette Hart is interested in becoming our patient and working on intensive lifestyle modifications including (but not limited to) diet and exercise for weight loss.  Yvette Hart's habits were reviewed today and are as follows: Her family eats meals together, her desired weight loss is 79 lbs, she started gaining weight at age 41, her heaviest weight ever was 252 pounds, she is a picky eater and doesn't like to eat healthier foods, she has significant food cravings issues, she skips meals frequently, she is frequently drinking liquids with calories, she frequently makes poor food choices, she has problems with excessive hunger, she frequently eats larger portions than normal, and she struggles with emotional eating.  Depression Screen Yvette Hart's Food and Mood (modified PHQ-9) score was 18.     08/08/2022    8:51 AM  Depression screen PHQ 2/9  Decreased Interest 3  Down, Depressed, Hopeless 2  PHQ - 2 Score 5  Altered sleeping 1  Tired, decreased energy 3  Change in appetite 3  Feeling bad or failure about yourself  1  Trouble concentrating 3  Moving slowly or fidgety/restless 2  Suicidal thoughts 0  PHQ-9 Score 18  Difficult doing work/chores Extremely dIfficult   Subjective:   1. Other fatigue Miyonna admits to daytime somnolence and admits to waking up still tired. Patient has a history of symptoms of daytime fatigue and morning fatigue. Amariss generally gets 7 hours of sleep per night, and states  that she has nightime awakenings. Snoring is present. Apneic episodes are present. Epworth Sleepiness Score is 5.   2. SOB (shortness of breath) Yvette Hart notes increasing shortness of breath with exercising and seems to be worsening over time with weight gain. She notes getting out of breath sooner with activity than she used to. This has not gotten worse recently. Yvette Hart denies shortness of breath at rest or orthopnea.  3. Other hyperlipidemia Yvette Hart's last LDL ws 127, with a goal less than 100.  4. Fatty liver disease Diagnosis is based on imaging per the patient. Liver enzymes are within normal limits. I discussed labs with the patient today.   5. Epigastric pain Yvette Hart has a history of stomach ulcers, pain for 2 years, and abnormal ultrasound at [facility]. She also has GERD at night.   6. Abnormal craving Kyrie's last A1c was 5.1, check insulin for insulin resistance. Compounded by high GI foods.   Assessment/Plan:   1. Other fatigue Colette does feel that her weight is causing her energy to be lower than it should be. Fatigue may be related to obesity, depression or many other causes. Labs will be ordered, and in the meanwhile, Zury will focus on self care including making healthy food choices, increasing physical activity and focusing on stress reduction.  - Insulin, random - VITAMIN D 25 Hydroxy (Vit-D Deficiency, Fractures); Future - Vitamin B12  2. SOB (shortness of breath) Yvette Hart does feel that she gets out of breath more easily that she used to when she exercises. Yvette Hart's shortness of breath  appears to be obesity related and exercise induced. She has agreed to work on weight loss and gradually increase exercise to treat her exercise induced shortness of breath. Will continue to monitor closely.  3. Other hyperlipidemia Reduced calorie meal plan, low in saturated fats was provided. No need for statin.   4. Fatty liver disease Yvette Hart will work on her weight loss  therapy. GLP-1 may be of benefit. Total weight loss for benefit is 10-15%.   5. Epigastric pain Yvette Hart is to schedule an appointment with her PCP for further evaluation.   6. Abnormal craving Yvette Hart was counseled on behavioral approaches. She is to decrease high GI foods, and begin her low carbohydrate, high protein meal plan.   7. Depression screening Yvette Hart had a positive depression screening. Depression is commonly associated with obesity and often results in emotional eating behaviors. We will monitor this closely and work on CBT to help improve the non-hunger eating patterns. Referral to Psychology may be required if no improvement is seen as she continues in our clinic.  8. Class 3 severe obesity without serious comorbidity with body mass index (BMI) of 40.0 to 44.9 in adult, unspecified obesity type (HCC) Yvette Hart is currently in the action stage of change and her goal is to continue with weight loss efforts. I recommend Yvette Hart begin the structured treatment plan as follows:  She has agreed to the Category 2 Plan.  Exercise goals: No exercise has been prescribed at this time.   Behavioral modification strategies: increasing lean protein intake, decreasing simple carbohydrates, increasing water intake, decreasing liquid calories, no skipping meals, meal planning and cooking strategies, keeping healthy foods in the home, better snacking choices, avoiding temptations, and planning for success.  She was informed of the importance of frequent follow-up visits to maximize her success with intensive lifestyle modifications for her multiple health conditions. She was informed we would discuss her lab results at her next visit unless there is a critical issue that needs to be addressed sooner. Yvette Hart agreed to keep her next visit at the agreed upon time to discuss these results.  Objective:   Blood pressure (!) 114/7, pulse (!) 55, temperature 98.3 F (36.8 C), height 5\' 5"  (1.651 m), weight 249  lb (112.9 kg), SpO2 100 %, unknown if currently breastfeeding. Body mass index is 41.44 kg/m.  EKG: Normal sinus rhythm, rate 61 BPM.  Indirect Calorimeter completed today shows a VO2 of 216 and a REE of 1483.  Her calculated basal metabolic rate is thus her basal metabolic rate is worse than expected.  General: Cooperative, alert, well developed, in no acute distress. HEENT: Conjunctivae and lids unremarkable. Cardiovascular: Regular rhythm.  Lungs: Normal work of breathing. Neurologic: No focal deficits.   Lab Results  Component Value Date   CREATININE 0.65 05/16/2022   BUN 13 05/16/2022   NA 139 05/16/2022   K 4.7 05/16/2022   CL 104 05/16/2022   CO2 27 05/16/2022   Lab Results  Component Value Date   ALT 23 05/16/2022   AST 25 05/16/2022   ALKPHOS 59 05/16/2022   BILITOT 1.0 05/16/2022   Lab Results  Component Value Date   HGBA1C 5.1 05/16/2022   Lab Results  Component Value Date   INSULIN 10.3 08/08/2022   Lab Results  Component Value Date   TSH 1.22 05/16/2022   Lab Results  Component Value Date   CHOL 187 05/16/2022   HDL 48.40 05/16/2022   LDLCALC 127 (H) 05/16/2022   TRIG 59.0 05/16/2022  CHOLHDL 4 05/16/2022   Lab Results  Component Value Date   WBC 6.5 05/16/2022   HGB 15.0 05/16/2022   HCT 45.0 05/16/2022   MCV 88.4 05/16/2022   PLT 248.0 05/16/2022   No results found for: "IRON", "TIBC", "FERRITIN"  Attestation Statements:   Reviewed by clinician on day of visit: allergies, medications, problem list, medical history, surgical history, family history, social history, and previous encounter notes.  Time spent on visit including pre-visit chart review and post-visit charting and care was 40 minutes.   Wilhemena Durie, am acting as transcriptionist for Thomes Dinning, MD.  I have reviewed the above documentation for accuracy and completeness, and I agree with the above. -Thomes Dinning, MD

## 2022-08-18 ENCOUNTER — Encounter (INDEPENDENT_AMBULATORY_CARE_PROVIDER_SITE_OTHER): Payer: Self-pay | Admitting: Internal Medicine

## 2022-08-21 ENCOUNTER — Encounter (INDEPENDENT_AMBULATORY_CARE_PROVIDER_SITE_OTHER): Payer: Self-pay | Admitting: Internal Medicine

## 2022-08-22 ENCOUNTER — Encounter (INDEPENDENT_AMBULATORY_CARE_PROVIDER_SITE_OTHER): Payer: Self-pay | Admitting: Internal Medicine

## 2022-08-22 ENCOUNTER — Ambulatory Visit (INDEPENDENT_AMBULATORY_CARE_PROVIDER_SITE_OTHER): Payer: No Typology Code available for payment source | Admitting: Internal Medicine

## 2022-08-22 VITALS — BP 113/79 | HR 5 | Temp 98.6°F | Ht 65.0 in | Wt 244.0 lb

## 2022-08-22 DIAGNOSIS — Z6841 Body Mass Index (BMI) 40.0 and over, adult: Secondary | ICD-10-CM

## 2022-08-22 DIAGNOSIS — R638 Other symptoms and signs concerning food and fluid intake: Secondary | ICD-10-CM | POA: Diagnosis not present

## 2022-08-22 DIAGNOSIS — R5383 Other fatigue: Secondary | ICD-10-CM | POA: Diagnosis not present

## 2022-08-22 DIAGNOSIS — E7849 Other hyperlipidemia: Secondary | ICD-10-CM

## 2022-08-22 DIAGNOSIS — R1013 Epigastric pain: Secondary | ICD-10-CM

## 2022-08-22 DIAGNOSIS — E669 Obesity, unspecified: Secondary | ICD-10-CM

## 2022-08-23 LAB — CBC WITH DIFFERENTIAL/PLATELET
Basophils Absolute: 0 10*3/uL (ref 0.0–0.2)
Basos: 1 %
EOS (ABSOLUTE): 0.1 10*3/uL (ref 0.0–0.4)
Eos: 1 %
Hematocrit: 44.4 % (ref 34.0–46.6)
Hemoglobin: 15.3 g/dL (ref 11.1–15.9)
Immature Grans (Abs): 0 10*3/uL (ref 0.0–0.1)
Immature Granulocytes: 0 %
Lymphocytes Absolute: 2.3 10*3/uL (ref 0.7–3.1)
Lymphs: 39 %
MCH: 30.5 pg (ref 26.6–33.0)
MCHC: 34.5 g/dL (ref 31.5–35.7)
MCV: 88 fL (ref 79–97)
Monocytes Absolute: 0.4 10*3/uL (ref 0.1–0.9)
Monocytes: 7 %
Neutrophils Absolute: 3.2 10*3/uL (ref 1.4–7.0)
Neutrophils: 52 %
Platelets: 242 10*3/uL (ref 150–450)
RBC: 5.02 x10E6/uL (ref 3.77–5.28)
RDW: 12.4 % (ref 11.7–15.4)
WBC: 6 10*3/uL (ref 3.4–10.8)

## 2022-08-23 LAB — COMPREHENSIVE METABOLIC PANEL
ALT: 24 IU/L (ref 0–32)
AST: 19 IU/L (ref 0–40)
Albumin/Globulin Ratio: 2 (ref 1.2–2.2)
Albumin: 4.6 g/dL (ref 3.9–4.9)
Alkaline Phosphatase: 64 IU/L (ref 44–121)
BUN/Creatinine Ratio: 11 (ref 9–23)
BUN: 8 mg/dL (ref 6–20)
Bilirubin Total: 1.2 mg/dL (ref 0.0–1.2)
CO2: 24 mmol/L (ref 20–29)
Calcium: 9.3 mg/dL (ref 8.7–10.2)
Chloride: 101 mmol/L (ref 96–106)
Creatinine, Ser: 0.71 mg/dL (ref 0.57–1.00)
Globulin, Total: 2.3 g/dL (ref 1.5–4.5)
Glucose: 85 mg/dL (ref 70–99)
Potassium: 4.6 mmol/L (ref 3.5–5.2)
Sodium: 139 mmol/L (ref 134–144)
Total Protein: 6.9 g/dL (ref 6.0–8.5)
eGFR: 115 mL/min/{1.73_m2} (ref >59)

## 2022-08-23 LAB — VITAMIN D 25 HYDROXY (VIT D DEFICIENCY, FRACTURES): Vit D, 25-Hydroxy: 29.6 ng/mL — ABNORMAL LOW (ref 30.0–100.0)

## 2022-08-23 LAB — LIPID PANEL WITH LDL/HDL RATIO
Cholesterol, Total: 154 mg/dL (ref 100–199)
HDL: 47 mg/dL (ref >39)
LDL Chol Calc (NIH): 93 mg/dL (ref 0–99)
LDL/HDL Ratio: 2 ratio (ref 0.0–3.2)
Triglycerides: 74 mg/dL (ref 0–149)
VLDL Cholesterol Cal: 14 mg/dL (ref 5–40)

## 2022-08-23 LAB — HEMOGLOBIN A1C
Est. average glucose Bld gHb Est-mCnc: 94 mg/dL
Hgb A1c MFr Bld: 4.9 % (ref 4.8–5.6)

## 2022-08-23 LAB — TSH: TSH: 1.15 u[IU]/mL (ref 0.450–4.500)

## 2022-08-23 LAB — VITAMIN B12: Vitamin B-12: 376 pg/mL (ref 232–1245)

## 2022-08-29 DEATH — deceased

## 2022-09-04 NOTE — Progress Notes (Signed)
Chief Complaint:   OBESITY Yvette Hart is here to discuss her progress with her obesity treatment plan along with follow-up of her obesity related diagnoses. Yvette Hart is on the Category 2 Plan and states she is following her eating plan approximately 85% of the time. Yvette Hart states she is doing 0 minutes 0 times per week.  Today's visit was #: 2 Starting weight: 249 lbs Starting date: 08/08/2022 Today's weight: 244 lbs Today's date: 08/22/2022 Total lbs lost to date: 5 Total lbs lost since last in-office visit: 5  Interim History: Yvette Hart is doing well.  She has noticed increased energy, but she notes that satiety with meals is an issue.  She has eliminated soda, sweets, and eating out.  She is also making new recipes, healthier choices, and journals.  She is thinking of starting to go to [employer] 3 times a week while waiting for her child.  Labs requested were not completed.  Subjective:   1. Other fatigue Yvette Hart's Epworth score was 5.  2. Abnormal craving Yvette Hart's last A1c was 5.1 and insulin 10.3.  She has improved with implementation of the meal plan and reduction of added sugars and simple carbohydrates.  I discussed labs with the patient today.  3. Other hyperlipidemia Yvette Hart is last LDL was 127.  I discussed labs with the patient today.  4. Epigastric pain Yvette Hart's symptoms have resolved with improved nutrition.  Assessment/Plan:   1. Other fatigue We will check labs today, we will follow-up at Landy's next office visit.  - VITAMIN D 25 Hydroxy (Vit-D Deficiency, Fractures) - Vitamin B12 - TSH  2. Abnormal craving Yvette Hart will continue with her weight loss therapy.  3. Other hyperlipidemia We will check FLP today.  Yvette Hart will continue with her weight loss therapy.  4. Epigastric pain Yvette Hart will continue to monitor for worsening symptoms.  5. Obesity with current BMI 40.6 Yvette Hart is currently in the action stage of change. As such, her goal is to continue  with weight loss efforts. She has agreed to the Category 2 Plan.   We will check labs today.  - CBC with Differential/Platelet - Comprehensive metabolic panel - Hemoglobin A1c - Lipid Panel With LDL/HDL Ratio  Exercise goals: Start walking at [employer] 2-3 times a week.  Behavioral modification strategies: increasing lean protein intake and increasing vegetables.  Yvette Hart has agreed to follow-up with our clinic in 2 weeks. She was informed of the importance of frequent follow-up visits to maximize her success with intensive lifestyle modifications for her multiple health conditions.   Yvette Hart was informed we would discuss her lab results at her next visit unless there is a critical issue that needs to be addressed sooner. Yvette Hart agreed to keep her next visit at the agreed upon time to discuss these results.  Objective:   Blood pressure 113/79, pulse (!) 5, temperature 98.6 F (37 C), height 5\' 5"  (1.651 m), weight 244 lb (110.7 kg), SpO2 99 %, unknown if currently breastfeeding. Body mass index is 40.6 kg/m.  General: Cooperative, alert, well developed, in no acute distress. HEENT: Conjunctivae and lids unremarkable. Cardiovascular: Regular rhythm.  Lungs: Normal work of breathing. Neurologic: No focal deficits.   Lab Results  Component Value Date   CREATININE 0.71 08/22/2022   BUN 8 08/22/2022   NA 139 08/22/2022   K 4.6 08/22/2022   CL 101 08/22/2022   CO2 24 08/22/2022   Lab Results  Component Value Date   ALT 24 08/22/2022   AST 19  08/22/2022   ALKPHOS 64 08/22/2022   BILITOT 1.2 08/22/2022   Lab Results  Component Value Date   HGBA1C 4.9 08/22/2022   HGBA1C 5.1 05/16/2022   Lab Results  Component Value Date   INSULIN 10.3 08/08/2022   Lab Results  Component Value Date   TSH 1.150 08/22/2022   Lab Results  Component Value Date   CHOL 154 08/22/2022   HDL 47 08/22/2022   LDLCALC 93 08/22/2022   TRIG 74 08/22/2022   CHOLHDL 4 05/16/2022   Lab  Results  Component Value Date   VD25OH 29.6 (L) 08/22/2022   Lab Results  Component Value Date   WBC 6.0 08/22/2022   HGB 15.3 08/22/2022   HCT 44.4 08/22/2022   MCV 88 08/22/2022   PLT 242 08/22/2022   No results found for: "IRON", "TIBC", "FERRITIN"  Attestation Statements:   Reviewed by clinician on day of visit: allergies, medications, problem list, medical history, surgical history, family history, social history, and previous encounter notes.  Time spent on visit including pre-visit chart review and post-visit care and charting was 40 minutes.   Yvette Hart, am acting as transcriptionist for Worthy Rancher, MD.  I have reviewed the above documentation for accuracy and completeness, and I agree with the above. -Worthy Rancher, MD

## 2022-09-19 ENCOUNTER — Ambulatory Visit (INDEPENDENT_AMBULATORY_CARE_PROVIDER_SITE_OTHER): Payer: No Typology Code available for payment source | Admitting: Family Medicine

## 2022-10-02 NOTE — Progress Notes (Signed)
TeleHealth Visit:  This visit was completed with telemedicine (audio/video) technology. [name] has verbally consented to this TeleHealth visit. The patient is located at home, the provider is located at home. The participants in this visit include the listed provider and patient. The visit was conducted today via MyChart video.  OBESITY Yvette Hart is here to discuss her progress with her obesity treatment plan along with follow-up of her obesity related diagnoses.   Today's visit was # 3 Starting weight: 249 lbs Starting date: 08/08/2022 Weight at last in office visit: 244 lbs on 08/22/22 Total weight loss: 5 lbs at last in office visit on 08/22/22. Today's reported weight:  No weight reported.  Nutrition Plan: the Category 2 Plan.   Current exercise:  none  Interim History: We are doing a virtual visit because she has COVID. [name] says her weight is stable but she feels that she has lost inches. She did well over the holidays and used portion control.  She says she has been doing some grazing.  She is not full after meals despite eating all of the food.  Assessment/Plan:  We discussed recent lab results in depth.  1. Insulin Resistance Fasting insulin elevated at 10.3. She reports  polyphagia. Medication(s): none Has been on Victoza in the past and felt very tired.  She was on topiramate at the same time.  Reginal Lutes has been prescribed in the past for her but she did not have coverage for this. Lab Results  Component Value Date   HGBA1C 4.9 08/22/2022   Lab Results  Component Value Date   INSULIN 10.3 08/08/2022    Plan Start metformin 500 mg daily with lunch.   2. Vitamin D Deficiency Vitamin D is not at goal of 50.  Reports fatigue. Not on supplementation. Lab Results  Component Value Date   VD25OH 29.6 (L) 08/22/2022    Plan: Start prescription vitamin D 50,000 IU weekly.   3. Obesity: Current BMI 40.6 [name] is currently in the action stage of change.  As such, her goal is to continue with weight loss efforts.  She has agreed to the Category 2 Plan.   Exercise goals: No exercise has been prescribed at this time.  Behavioral modification strategies: increasing lean protein intake, decreasing simple carbohydrates, and planning for success.  Haneefah has agreed to follow-up with our clinic in 2 weeks.   No orders of the defined types were placed in this encounter.   There are no discontinued medications.   Meds ordered this encounter  Medications   Vitamin D, Ergocalciferol, (DRISDOL) 1.25 MG (50000 UNIT) CAPS capsule    Sig: Take 1 capsule (50,000 Units total) by mouth every 7 (seven) days.    Dispense:  5 capsule    Refill:  0    Order Specific Question:   Supervising Provider    Answer:   Glennis Brink [[id number]]   metFORMIN (GLUCOPHAGE) 500 MG tablet    Sig: Take 1 tablet (500 mg total) by mouth daily with lunch.    Dispense:  30 tablet    Refill:  0    Order Specific Question:   Supervising Provider    Answer:   Glennis Brink [[id number]]      Objective:   VITALS: Per patient if applicable, see vitals. GENERAL: Alert and in no acute distress. CARDIOPULMONARY: No increased WOB. Speaking in clear sentences.  PSYCH: Pleasant and cooperative. Speech normal rate and rhythm. Affect is appropriate. Insight and judgement are appropriate. Attention is focused, linear,  and appropriate.  NEURO: Oriented as arrived to appointment on time with no prompting.   Lab Results  Component Value Date   CREATININE 0.71 08/22/2022   BUN 8 08/22/2022   NA 139 08/22/2022   K 4.6 08/22/2022   CL 101 08/22/2022   CO2 24 08/22/2022   Lab Results  Component Value Date   ALT 24 08/22/2022   AST 19 08/22/2022   ALKPHOS 64 08/22/2022   BILITOT 1.2 08/22/2022   Lab Results  Component Value Date   HGBA1C 4.9 08/22/2022   HGBA1C 5.1 05/16/2022   Lab Results  Component Value Date   INSULIN 10.3 08/08/2022   Lab Results  Component Value Date    TSH 1.150 08/22/2022   Lab Results  Component Value Date   CHOL 154 08/22/2022   HDL 47 08/22/2022   LDLCALC 93 08/22/2022   TRIG 74 08/22/2022   CHOLHDL 4 05/16/2022   Lab Results  Component Value Date   WBC 6.0 08/22/2022   HGB 15.3 08/22/2022   HCT 44.4 08/22/2022   MCV 88 08/22/2022   PLT 242 08/22/2022   No results found for: "IRON", "TIBC", "FERRITIN" Lab Results  Component Value Date   VD25OH 29.6 (L) 08/22/2022    Attestation Statements:   Reviewed by clinician on day of visit: allergies, medications, problem list, medical history, surgical history, family history, social history, and previous encounter notes.

## 2022-10-03 ENCOUNTER — Ambulatory Visit (INDEPENDENT_AMBULATORY_CARE_PROVIDER_SITE_OTHER): Payer: No Typology Code available for payment source | Admitting: Family Medicine

## 2022-10-03 ENCOUNTER — Telehealth (INDEPENDENT_AMBULATORY_CARE_PROVIDER_SITE_OTHER): Payer: No Typology Code available for payment source | Admitting: Family Medicine

## 2022-10-03 ENCOUNTER — Encounter (INDEPENDENT_AMBULATORY_CARE_PROVIDER_SITE_OTHER): Payer: Self-pay | Admitting: Family Medicine

## 2022-10-03 DIAGNOSIS — E559 Vitamin D deficiency, unspecified: Secondary | ICD-10-CM | POA: Insufficient documentation

## 2022-10-03 DIAGNOSIS — E88819 Insulin resistance, unspecified: Secondary | ICD-10-CM | POA: Diagnosis not present

## 2022-10-03 DIAGNOSIS — Z6841 Body Mass Index (BMI) 40.0 and over, adult: Secondary | ICD-10-CM | POA: Diagnosis not present

## 2022-10-03 DIAGNOSIS — E669 Obesity, unspecified: Secondary | ICD-10-CM | POA: Diagnosis not present

## 2022-10-03 MED ORDER — METFORMIN HCL 500 MG PO TABS: 500.0000 mg | ORAL_TABLET | Freq: Every day | ORAL | 0 refills | Status: DC

## 2022-10-03 MED ORDER — VITAMIN D (ERGOCALCIFEROL) 1.25 MG (50000 UNIT) PO CAPS: 50000.0000 [IU] | ORAL_CAPSULE | ORAL | 0 refills | Status: DC

## 2022-10-17 ENCOUNTER — Encounter (INDEPENDENT_AMBULATORY_CARE_PROVIDER_SITE_OTHER): Payer: Self-pay | Admitting: Family Medicine

## 2022-10-17 ENCOUNTER — Ambulatory Visit (INDEPENDENT_AMBULATORY_CARE_PROVIDER_SITE_OTHER): Payer: No Typology Code available for payment source | Admitting: Family Medicine

## 2022-10-17 VITALS — BP 110/74 | HR 62 | Temp 98.4°F | Ht 65.0 in | Wt 242.0 lb

## 2022-10-17 DIAGNOSIS — Z6841 Body Mass Index (BMI) 40.0 and over, adult: Secondary | ICD-10-CM

## 2022-10-17 DIAGNOSIS — E669 Obesity, unspecified: Secondary | ICD-10-CM | POA: Diagnosis not present

## 2022-10-17 DIAGNOSIS — E559 Vitamin D deficiency, unspecified: Secondary | ICD-10-CM

## 2022-10-17 DIAGNOSIS — E282 Polycystic ovarian syndrome: Secondary | ICD-10-CM | POA: Diagnosis not present

## 2022-10-25 NOTE — Progress Notes (Signed)
Chief Complaint:   OBESITY Yvette Hart is here to discuss her progress with her obesity treatment plan along with follow-up of her obesity related diagnoses. Yvette Hart is on the Category 2 Plan and states she is following her eating plan approximately 70% of the time. Yvette Hart states she is not exercising.  Today's visit was #: 4 Starting weight: 249 lbs Starting date: 08/08/2022 Today's weight: 242 lbs Today's date: 10/17/2022 Total lbs lost to date: 7 lbs Total lbs lost since last in-office visit: 2 lbs  Interim History: Patient had COVID since last visit.  Financial strain has been a barrier to progress.  Patient has reduced SSB's and intake of chips.  Meal planning for dinner has been hard.  Brings a [employer]  for lunch with fruit.  Staying on plan for breakfast some days.  Having 100 calorie nuts or pretzels for  2 snacks.  Craving for sweets.  Getting fast food for dinners most nights.  Subjective:   1. PCOS (polycystic ovarian syndrome) Patient started metformin 500 mg daily as much without adverse side effects.  Last fasting insulin was 10.3.  Still consuming some refined carbs and excess sugar.  2. Vitamin D deficiency 08/22/2022, vitamin D level 29.6.  Patient is on prescription vitamin D 50,000 IU weekly.  Energy level is improving.  Assessment/Plan:   1. PCOS (polycystic ovarian syndrome) Continue metformin.  Increase walking time.  2. Vitamin D deficiency Continue prescription vitamin D weekly.  Recheck level in 2 to 3 months.  3. Obesity,current BMI 40.3 Reviewed easy at home, budget friendly dinner options.  Yvette Hart is currently in the action stage of change. As such, her goal is to continue with weight loss efforts. She has agreed to the Category 2 Plan.   Exercise goals: All adults should avoid inactivity. Some physical activity is better than none, and adults who participate in any amount of physical activity gain some health benefits.  Behavioral  modification strategies: increasing lean protein intake, increasing vegetables, increasing water intake, decreasing eating out, no skipping meals, meal planning and cooking strategies, keeping healthy foods in the home, and holiday eating strategies .  Yvette Hart has agreed to follow-up with our clinic in 4 weeks. She was informed of the importance of frequent follow-up visits to maximize her success with intensive lifestyle modifications for her multiple health conditions.   Objective:   Blood pressure 110/74, pulse 62, temperature 98.4 F (36.9 C), height 5\' 5"  (1.651 m), weight 242 lb (109.8 kg), SpO2 100 %, unknown if currently breastfeeding. Body mass index is 40.27 kg/m.  General: Cooperative, alert, well developed, in no acute distress. HEENT: Conjunctivae and lids unremarkable. Cardiovascular: Regular rhythm.  Lungs: Normal work of breathing. Neurologic: No focal deficits.   Lab Results  Component Value Date   CREATININE 0.71 08/22/2022   BUN 8 08/22/2022   NA 139 08/22/2022   K 4.6 08/22/2022   CL 101 08/22/2022   CO2 24 08/22/2022   Lab Results  Component Value Date   ALT 24 08/22/2022   AST 19 08/22/2022   ALKPHOS 64 08/22/2022   BILITOT 1.2 08/22/2022   Lab Results  Component Value Date   HGBA1C 4.9 08/22/2022   HGBA1C 5.1 05/16/2022   Lab Results  Component Value Date   INSULIN 10.3 08/08/2022   Lab Results  Component Value Date   TSH 1.150 08/22/2022   Lab Results  Component Value Date   CHOL 154 08/22/2022   HDL 47 08/22/2022  LDLCALC 93 08/22/2022   TRIG 74 08/22/2022   CHOLHDL 4 05/16/2022   Lab Results  Component Value Date   VD25OH 29.6 (L) 08/22/2022   Lab Results  Component Value Date   WBC 6.0 08/22/2022   HGB 15.3 08/22/2022   HCT 44.4 08/22/2022   MCV 88 08/22/2022   PLT 242 08/22/2022   No results found for: "IRON", "TIBC", "FERRITIN"  Attestation Statements:   Reviewed by clinician on day of visit: allergies, medications,  problem list, medical history, surgical history, family history, social history, and previous encounter notes.  I, Malcolm Metro, am acting as Energy manager for Seymour Bars, DO.  I have reviewed the above documentation for accuracy and completeness, and I agree with the above. Glennis Brink, DO

## 2022-11-07 ENCOUNTER — Other Ambulatory Visit (INDEPENDENT_AMBULATORY_CARE_PROVIDER_SITE_OTHER): Payer: Self-pay | Admitting: Family Medicine

## 2022-11-07 ENCOUNTER — Ambulatory Visit: Payer: No Typology Code available for payment source | Admitting: Cardiology

## 2022-11-07 DIAGNOSIS — E88819 Insulin resistance, unspecified: Secondary | ICD-10-CM

## 2022-11-14 ENCOUNTER — Encounter (INDEPENDENT_AMBULATORY_CARE_PROVIDER_SITE_OTHER): Payer: Self-pay | Admitting: Internal Medicine

## 2022-11-14 ENCOUNTER — Ambulatory Visit (INDEPENDENT_AMBULATORY_CARE_PROVIDER_SITE_OTHER): Payer: No Typology Code available for payment source | Admitting: Internal Medicine

## 2022-11-14 ENCOUNTER — Other Ambulatory Visit (INDEPENDENT_AMBULATORY_CARE_PROVIDER_SITE_OTHER): Payer: Self-pay | Admitting: Internal Medicine

## 2022-11-14 VITALS — BP 130/82 | HR 62 | Temp 98.2°F | Ht 65.0 in | Wt 240.0 lb

## 2022-11-14 DIAGNOSIS — E559 Vitamin D deficiency, unspecified: Secondary | ICD-10-CM | POA: Diagnosis not present

## 2022-11-14 DIAGNOSIS — E669 Obesity, unspecified: Secondary | ICD-10-CM | POA: Diagnosis not present

## 2022-11-14 DIAGNOSIS — E88819 Insulin resistance, unspecified: Secondary | ICD-10-CM | POA: Diagnosis not present

## 2022-11-14 DIAGNOSIS — Z6839 Body mass index (BMI) 39.0-39.9, adult: Secondary | ICD-10-CM

## 2022-11-14 MED ORDER — VITAMIN D (ERGOCALCIFEROL) 1.25 MG (50000 UNIT) PO CAPS: 50000.0000 [IU] | ORAL_CAPSULE | ORAL | 0 refills | Status: DC

## 2022-11-14 MED ORDER — METFORMIN HCL 500 MG PO TABS: 500.0000 mg | ORAL_TABLET | Freq: Two times a day (BID) | ORAL | 0 refills | Status: AC

## 2022-11-14 NOTE — Progress Notes (Signed)
Office: [phone]  /  Fax: [phone]  HPI  Chief Complaint: OBESITY  Yvette Hart is here to discuss her progress with her obesity treatment plan. She is on the the Category 2 Plan and states she is following her eating plan approximately 80 % of the time. She states she is not currently exercising.  Today's visit was # 5 Starting weight: 249 lbs Starting date: 08/08/2022 Today's weight : 240 lbs Today's date: 11/14/2022 Total lbs lost to date: 9 lbs Total lbs lost since last in-office visit: 2llbs  Interval History: Since last office visit she has lost 2 pounds.  She shares frustration as she would like to lose weight at a faster pace.  She feels like she is putting a good effort.  She is following plan with good adherence and has been very mindful about her eating.  Since I last saw her she has been started on metformin by one of my colleagues and reports medication has been helpful with appetite control.  She denies skipping meals or consumption of liquid calories.   Pharmacotherapy: On Metformin (off label use for incretin effect, insulin resistance and diabetes prevention) Denies adverse effects to medication. Notes improvement  in hunger signals, cravings or satiety..  PHYSICAL EXAM:  Blood pressure 130/82, pulse 62, temperature 98.2 F (36.8 C), height 5\' 5"  (1.651 m), weight 240 lb (108.9 kg), SpO2 99 %, unknown if currently breastfeeding. Body mass index is 39.94 kg/m.  General: She is overweight, cooperative, alert, well developed, and in no acute distress. PSYCH: Has normal mood, affect and thought process.   HEENT: EOMI, sclerae are anicteric. Lungs: Normal breathing effort, no conversational dyspnea. Extremities: No edema.  Neurologic: No gross sensory or motor deficits. No tremors or fasciculations noted.    DIAGNOSTIC DATA REVIEWED:  BMET    Component Value Date/Time   NA 139 08/22/2022 1118   K 4.6 08/22/2022 1118   CL 101 08/22/2022 1118   CO2 24  08/22/2022 1118   GLUCOSE 85 08/22/2022 1118   GLUCOSE 87 05/16/2022 1037   BUN 8 08/22/2022 1118   CREATININE 0.71 08/22/2022 1118   CALCIUM 9.3 08/22/2022 1118   GFRNONAA >60 01/26/2018 0939   GFRAA >60 01/26/2018 0939   Lab Results  Component Value Date   HGBA1C 4.9 08/22/2022   HGBA1C 5.1 05/16/2022   Lab Results  Component Value Date   INSULIN 10.3 08/08/2022   CBC    Component Value Date/Time   WBC 6.0 08/22/2022 1118   WBC 6.5 05/16/2022 1037   RBC 5.02 08/22/2022 1118   RBC 5.10 05/16/2022 1037   HGB 15.3 08/22/2022 1118   HCT 44.4 08/22/2022 1118   PLT 242 08/22/2022 1118   MCV 88 08/22/2022 1118   MCH 30.5 08/22/2022 1118   MCH 30.5 01/27/2018 0530   MCHC 34.5 08/22/2022 1118   MCHC 33.3 05/16/2022 1037   RDW 12.4 08/22/2022 1118   Iron/TIBC/Ferritin/ %Sat No results found for: "IRON", "TIBC", "FERRITIN", "IRONPCTSAT" Lipid Panel     Component Value Date/Time   CHOL 154 08/22/2022 1118   TRIG 74 08/22/2022 1118   HDL 47 08/22/2022 1118   CHOLHDL 4 05/16/2022 1037   VLDL 11.8 05/16/2022 1037   LDLCALC 93 08/22/2022 1118   Hepatic Function Panel     Component Value Date/Time   PROT 6.9 08/22/2022 1118   ALBUMIN 4.6 08/22/2022 1118   AST 19 08/22/2022 1118   ALT 24 08/22/2022 1118   ALKPHOS 64 08/22/2022 1118  BILITOT 1.2 08/22/2022 1118      Component Value Date/Time   TSH 1.150 08/22/2022 1118     ASSESSMENT AND PLAN   3. Obesity,current BMI 39.9 TREATMENT PLAN FOR OBESITY:  Recommended Dietary Goals  Yvette Hart is currently in the action stage of change. As such, her goal is to continue with weight loss efforts. She has agreed to the Category 2 Plan.  Behavioral Intervention  We discussed the following Behavioral Modification Strategies today: increasing lean protein intake, increasing water intake, no skipping meals, meal planning and cooking strategies, avoiding temptations, and planning for success.   Recommended Physical  Activity Goals  Yvette Hart has been instructed to work up to 150 minutes of moderate intensity aerobic activity a week and strengthening exercises 2-3 times per week for cardiovascular health, weight loss maintenance and preservation of muscle mass.   Continue with NEAT    Pharmacotherapy We discussed various medication options to help Yvette Hart with her weight loss efforts and we both agreed to increase metformin to twice a day for incretin effect.  Medication refilled for 30 days  ASSOCIATED CONDITIONS ADDRESSED TODAY  1. Insulin resistance Patient has elevated insulin levels with associated increased hunger signals and cravings.  This is improving with metformin.  She will continue medication.  No side effects reported  2. Vitamin D deficiency Associated with excess adiposity and may result in leptin resistance and adipogenesis. On high-dose supplementation without any adverse effect.  She will continue current regimen medication refilled we will check levels at 3 to 4 months.    Yvette Hart has agreed to follow-up with our clinic in 4 weeks. She was informed of the importance of frequent follow-up visits to maximize her success with intensive lifestyle modifications for her multiple health conditions.   Thomes Dinning, MD

## 2022-12-19 ENCOUNTER — Ambulatory Visit (INDEPENDENT_AMBULATORY_CARE_PROVIDER_SITE_OTHER): Payer: No Typology Code available for payment source | Admitting: Internal Medicine

## 2023-01-11 ENCOUNTER — Other Ambulatory Visit: Payer: Self-pay | Admitting: Obstetrics and Gynecology

## 2023-01-11 DIAGNOSIS — N6452 Nipple discharge: Secondary | ICD-10-CM

## 2023-01-11 DIAGNOSIS — N644 Mastodynia: Secondary | ICD-10-CM

## 2023-01-24 ENCOUNTER — Ambulatory Visit
Admission: RE | Admit: 2023-01-24 | Discharge: 2023-01-24 | Disposition: A | Discharge status: Home / Self Care | Payer: No Typology Code available for payment source | Source: Ambulatory Visit | Attending: Obstetrics and Gynecology | Admitting: Obstetrics and Gynecology

## 2023-01-24 DIAGNOSIS — N6452 Nipple discharge: Secondary | ICD-10-CM

## 2023-01-24 DIAGNOSIS — N644 Mastodynia: Secondary | ICD-10-CM

## 2023-01-24 HISTORY — DX: Nipple discharge: N64.52

## 2023-02-13 ENCOUNTER — Ambulatory Visit (INDEPENDENT_AMBULATORY_CARE_PROVIDER_SITE_OTHER): Payer: No Typology Code available for payment source | Admitting: Adult Health

## 2023-02-13 ENCOUNTER — Encounter: Payer: Self-pay | Admitting: Adult Health

## 2023-02-13 VITALS — BP 100/70 | HR 62 | Temp 98.0°F | Ht 65.0 in | Wt 244.0 lb

## 2023-02-13 DIAGNOSIS — F419 Anxiety disorder, unspecified: Secondary | ICD-10-CM | POA: Diagnosis not present

## 2023-02-13 DIAGNOSIS — F32A Depression, unspecified: Secondary | ICD-10-CM

## 2023-02-13 DIAGNOSIS — R454 Irritability and anger: Secondary | ICD-10-CM

## 2023-02-13 MED ORDER — BUPROPION HCL ER (XL) 150 MG PO TB24: 150.0000 mg | ORAL_TABLET | Freq: Every day | ORAL | 0 refills | Status: DC

## 2023-02-13 NOTE — Progress Notes (Signed)
Subjective:    Patient ID: Yvette Hart, female    DOB: Mar 25, 1991, 32 y.o.   MRN: [id number]  Anxiety    Depression        Past medical history includes anxiety.    32 year old female who  has a past medical history of Anxiety, Back pain, Breast discharge, Cardiomyopathy, Chest pain, Chicken pox, Constipation, Depression, Fatty liver, Food allergy, Gallbladder problem, GERD (gastroesophageal reflux disease), Heart valve problem, Hepatic artery stenosis, Hip pain, History of swelling of feet, Hyperlipidemia, IBS (irritable bowel syndrome), Joint pain, Lactose intolerance, Neck pain, Obesity, Osteoarthritis, Palpitations, PCOS (polycystic ovarian syndrome), SOB (shortness of breath), Stomach ulcer, SVD (spontaneous vaginal delivery) (01/26/2018), Swallowing difficulty, Urinary incontinence, and Vitamin D deficiency.  She presents to the office for anxiety and depression that has been present for many years. She feels as though her symptoms have been getting worse. She does not enjoy doing things that she once did, has a time getting out of bed in the morning due to lack of motivation, brain fog, fatigue, and irritation. She feels as though her symptoms are effecting her ability to be a mother, wife and person as a whole.   Wt Readings from Last 3 Encounters:  02/13/23 244 lb (110.7 kg)  11/14/22 240 lb (108.9 kg)  10/17/22 242 lb (109.8 kg)    Review of Systems  Psychiatric/Behavioral:  Positive for depression.    See HPI   Past Medical History:  Diagnosis Date   Anxiety    Back pain    Breast discharge    Cardiomyopathy    monitored by [facility] medical [place] pt bringing records   Chest pain    Chicken pox    Constipation    Depression    Fatty liver    Food allergy    Gallbladder problem    GERD (gastroesophageal reflux disease)    Heart valve problem    Hepatic artery stenosis    Hip pain    History of swelling of feet    Hyperlipidemia    IBS (irritable bowel  syndrome)    Joint pain    Lactose intolerance    Neck pain    Obesity    Osteoarthritis    Palpitations    PCOS (polycystic ovarian syndrome)    SOB (shortness of breath)    Stomach ulcer    SVD (spontaneous vaginal delivery) 01/26/2018   Swallowing difficulty    Urinary incontinence    Vitamin D deficiency     Social History   Socioeconomic History   Marital status: Married    Spouse name: Not on file   Number of children: Not on file   Years of education: Not on file   Highest education level: Not on file  Occupational History   Not on file  Tobacco Use   Smoking status: Never   Smokeless tobacco: Never  Vaping Use   Vaping Use: Never used  Substance and Sexual Activity   Alcohol use: Yes    Alcohol/week: 3.0 standard drinks of alcohol    Types: 1 Glasses of wine, 1 Cans of beer, 1 Shots of liquor per week    Comment: social ONCE A MONTH   Drug use: No   Sexual activity: Yes    Birth control/protection: None  Other Topics Concern   Not on file  Social History Narrative   Not on file   Social Determinants of Health   Financial Resource Strain: Not on file  Food Insecurity: Not on file  Transportation Needs: Not on file  Physical Activity: Not on file  Stress: Not on file  Social Connections: Not on file  Intimate Partner Violence: Not on file    Past Surgical History:  Procedure Laterality Date   NO PAST SURGERIES      Family History  Problem Relation Age of Onset   Depression Mother    Miscarriages / [place] Mother        TWICE   Cancer Mother    Hypertension Father    Cancer Father    Mental illness Sister    Depression Sister    Asthma Brother    Depression Brother    Cervical cancer Paternal Aunt    Ovarian cancer Paternal Aunt    Stroke Maternal Grandmother    Heart disease Paternal Grandmother    Breast cancer Paternal Grandmother    Diabetes Paternal Grandmother    COPD Paternal Grandmother    Heart attack Paternal  Grandmother    Hypertension Paternal Grandmother    Colon polyps Paternal Grandfather    Colon cancer Paternal Grandfather    Crohn's disease Cousin    Esophageal cancer Neg Hx    Pancreatic cancer Neg Hx    Stomach cancer Neg Hx     Allergies  Allergen Reactions   Latex Rash   Onion Nausea And Vomiting and Other (See Comments)    Upset stomache    Current Outpatient Medications on File Prior to Visit  Medication Sig Dispense Refill   acetaminophen (TYLENOL) 500 MG tablet Take 500-1,000 mg by mouth every 6 (six) hours as needed for mild pain or headache.     ASHWAGANDHA PO Take by mouth.     cholecalciferol (VITAMIN D3) 25 MCG (1000 UNIT) tablet Take 1,000 Units by mouth daily.     levonorgestrel (MIRENA, 52 MG,) 20 MCG/DAY IUD Take 1 device by intrauterine route.     metFORMIN (GLUCOPHAGE) 500 MG tablet Take 1 tablet (500 mg total) by mouth 2 (two) times daily with a meal. 60 tablet 0   Vitamin D, Ergocalciferol, (DRISDOL) 1.25 MG (50000 UNIT) CAPS capsule Take 1 capsule (50,000 Units total) by mouth every 7 (seven) days. 12 capsule 0   No current facility-administered medications on file prior to visit.    BP 100/70   Pulse 62   Temp 98 F (36.7 C) (Oral)   Ht  (1.651 m)   Wt 244 lb (110.7 kg)   LMP 07/13/2022 Comment: IUD  SpO2 97%   BMI 40.60 kg/m       Objective:   Physical Exam Vitals and nursing note reviewed.  Constitutional:      Appearance: Normal appearance. She is obese.  Cardiovascular:     Rate and Rhythm: Normal rate and regular rhythm.     Pulses: Normal pulses.     Heart sounds: Normal heart sounds.  Pulmonary:     Effort: Pulmonary effort is normal.     Breath sounds: Normal breath sounds.  Skin:    General: Skin is warm and dry.  Neurological:     General: No focal deficit present.     Mental Status: She is alert and oriented to person, place, and time.  Psychiatric:        Mood and Affect: Mood normal.        Behavior: Behavior  normal.        Thought Content: Thought content normal.        Judgment:  Judgment normal.       Assessment & Plan:  1. Anxiety and depression Flowsheet Row Office Visit from 02/13/2023 in [facility] [place]  PHQ-9 Total Score 22         02/13/2023    2:02 PM  GAD 7 : Generalized Anxiety Score  Nervous, Anxious, on Edge 3  Control/stop worrying 3  Worry too much - different things 3  Trouble relaxing 2  Restless 1  Easily annoyed or irritable 3  Afraid - awful might happen 1  Total GAD 7 Score 16  Anxiety Difficulty Very difficult   WELLBUTRIN XL) 150 MG 24 hr tablet; Take 1 tablet (150 mg total) by mouth daily.  Dispense: 90 tablet; Refill: 0 - Will start on Wellbutrin 150 mg daily. Follow up in 1 month  2. Irritability  - buPROPion (WELLBUTRIN XL) 150 MG 24 hr tablet; Take 1 tablet (150 mg total) by mouth daily.  Dispense: 90 tablet; Refill: 0  Shirline Frees, NP  Time spent with patient today was 31 minutes which consisted of chart review, discussing anxiety and depression, work up, treatment answering questions and documentation.

## 2023-03-13 ENCOUNTER — Ambulatory Visit: Payer: No Typology Code available for payment source | Admitting: Adult Health

## 2023-03-13 ENCOUNTER — Encounter: Payer: Self-pay | Admitting: Adult Health

## 2023-03-13 VITALS — BP 100/70 | HR 68 | Temp 98.0°F | Ht 65.0 in | Wt 240.0 lb

## 2023-03-13 DIAGNOSIS — F32A Depression, unspecified: Secondary | ICD-10-CM

## 2023-03-13 DIAGNOSIS — R454 Irritability and anger: Secondary | ICD-10-CM

## 2023-03-13 DIAGNOSIS — F419 Anxiety disorder, unspecified: Secondary | ICD-10-CM

## 2023-03-13 MED ORDER — BUPROPION HCL ER (XL) 300 MG PO TB24: 300.0000 mg | ORAL_TABLET | Freq: Every day | ORAL | 0 refills | Status: DC

## 2023-03-13 NOTE — Progress Notes (Signed)
Subjective:    Patient ID: Yvette Hart, female    DOB: 07-Jan-1991, 32 y.o.   MRN: [id number]  HPI 32 year old female who  has a past medical history of Anxiety, Back pain, Breast discharge, Cardiomyopathy (HCC), Chest pain, Chicken pox, Constipation, Depression, Fatty liver, Food allergy, Gallbladder problem, GERD (gastroesophageal reflux disease), Heart valve problem, Hepatic artery stenosis (HCC), Hip pain, History of swelling of feet, Hyperlipidemia, IBS (irritable bowel syndrome), Joint pain, Lactose intolerance, Neck pain, Obesity, Osteoarthritis, Palpitations, PCOS (polycystic ovarian syndrome), SOB (shortness of breath), Stomach ulcer, SVD (spontaneous vaginal delivery) (01/26/2018), Swallowing difficulty, Urinary incontinence, and Vitamin D deficiency.  She presents to the office today for 1 month follow-up regarding anxiety and depression.  When she was seen a month ago she reported that she had symptoms for many years but thought as though her symptoms were getting worse.  She was not enjoying doing things that she once did, was having a hard time getting out of bed in the morning due to lack of motivation, brain fog, fatigue, and irritation.  She felt as though her symptoms were affecting her ability to be a mother, wife, and person as a whole.  Her PHQ-9 score was 22 and her GAD-7 was 16.  Started on Wellbutrin 150 mg daily.  Today she reports that she is tolerating this medication well, she had a day of vertigo when she first started it but this only lasted a day. She feels as though she still has anxiety and depression but it has improved.    Review of Systems See HPI   Past Medical History:  Diagnosis Date   Anxiety    Back pain    Breast discharge    Cardiomyopathy (HCC)    monitored by [facility] medical [place] pt bringing records   Chest pain    Chicken pox    Constipation    Depression    Fatty liver    Food allergy    Gallbladder problem    GERD  (gastroesophageal reflux disease)    Heart valve problem    Hepatic artery stenosis (HCC)    Hip pain    History of swelling of feet    Hyperlipidemia    IBS (irritable bowel syndrome)    Joint pain    Lactose intolerance    Neck pain    Obesity    Osteoarthritis    Palpitations    PCOS (polycystic ovarian syndrome)    SOB (shortness of breath)    Stomach ulcer    SVD (spontaneous vaginal delivery) 01/26/2018   Swallowing difficulty    Urinary incontinence    Vitamin D deficiency     Social History   Socioeconomic History   Marital status: Married    Spouse name: Not on file   Number of children: Not on file   Years of education: Not on file   Highest education level: Not on file  Occupational History   Not on file  Tobacco Use   Smoking status: Never   Smokeless tobacco: Never  Vaping Use   Vaping Use: Never used  Substance and Sexual Activity   Alcohol use: Yes    Alcohol/week: 3.0 standard drinks of alcohol    Types: 1 Glasses of wine, 1 Cans of beer, 1 Shots of liquor per week    Comment: social ONCE A MONTH   Drug use: No   Sexual activity: Yes    Birth control/protection: None  Other Topics Concern  Not on file  Social History Narrative   Not on file   Social Determinants of Health   Financial Resource Strain: Not on file  Food Insecurity: Not on file  Transportation Needs: Not on file  Physical Activity: Not on file  Stress: Not on file  Social Connections: Not on file  Intimate Partner Violence: Not on file    Past Surgical History:  Procedure Laterality Date   NO PAST SURGERIES      Family History  Problem Relation Age of Onset   Depression Mother    Miscarriages / [place] Mother        TWICE   Cancer Mother    Hypertension Father    Cancer Father    Mental illness Sister    Depression Sister    Asthma Brother    Depression Brother    Cervical cancer Paternal Aunt    Ovarian cancer Paternal Aunt    Stroke Maternal  Grandmother    Heart disease Paternal Grandmother    Breast cancer Paternal Grandmother    Diabetes Paternal Grandmother    COPD Paternal Grandmother    Heart attack Paternal Grandmother    Hypertension Paternal Grandmother    Colon polyps Paternal Grandfather    Colon cancer Paternal Grandfather    Crohn's disease Cousin    Esophageal cancer Neg Hx    Pancreatic cancer Neg Hx    Stomach cancer Neg Hx     Allergies  Allergen Reactions   Latex Rash   Onion Nausea And Vomiting and Other (See Comments)    Upset stomache    Current Outpatient Medications on File Prior to Visit  Medication Sig Dispense Refill   acetaminophen (TYLENOL) 500 MG tablet Take 500-1,000 mg by mouth every 6 (six) hours as needed for mild pain or headache.     ASHWAGANDHA PO Take by mouth.     buPROPion (WELLBUTRIN XL) 150 MG 24 hr tablet Take 1 tablet (150 mg total) by mouth daily. 90 tablet 0   cholecalciferol (VITAMIN D3) 25 MCG (1000 UNIT) tablet Take 1,000 Units by mouth daily.     levonorgestrel (MIRENA, 52 MG,) 20 MCG/DAY IUD Take 1 device by intrauterine route.     metFORMIN (GLUCOPHAGE) 500 MG tablet Take 1 tablet (500 mg total) by mouth 2 (two) times daily with a meal. 60 tablet 0   Vitamin D, Ergocalciferol, (DRISDOL) 1.25 MG (50000 UNIT) CAPS capsule Take 1 capsule (50,000 Units total) by mouth every 7 (seven) days. 12 capsule 0   No current facility-administered medications on file prior to visit.    BP 100/70   Pulse 68   Temp 98 F (36.7 C) (Oral)   Ht 5\' 5"  (1.651 m)   Wt 240 lb (108.9 kg)   SpO2 98%   BMI 39.94 kg/m       Objective:   Physical Exam Vitals and nursing note reviewed.  Constitutional:      Appearance: Normal appearance.  Cardiovascular:     Rate and Rhythm: Normal rate and regular rhythm.     Pulses: Normal pulses.     Heart sounds: Normal heart sounds.  Skin:    General: Skin is warm and dry.  Neurological:     General: No focal deficit present.     Mental  Status: She is alert and oriented to person, place, and time.  Psychiatric:        Mood and Affect: Mood normal.        Behavior: Behavior  normal.        Thought Content: Thought content normal.        Judgment: Judgment normal.       Assessment & Plan:   1. Anxiety and depression - PHQ 9 score = 13 and Gad 7 = 14 - She is improving. Will increase Wellbutrin to 300 mg daily.  - Follow up in 1 month  - buPROPion (WELLBUTRIN XL) 300 MG 24 hr tablet; Take 1 tablet (300 mg total) by mouth daily.  Dispense: 30 tablet; Refill: 0  2. Irritability  - buPROPion (WELLBUTRIN XL) 300 MG 24 hr tablet; Take 1 tablet (300 mg total) by mouth daily.  Dispense: 30 tablet; Refill: 0   Shirline Frees, NP

## 2023-03-29 ENCOUNTER — Encounter: Payer: Self-pay | Admitting: Adult Health

## 2023-04-10 ENCOUNTER — Ambulatory Visit: Payer: No Typology Code available for payment source | Admitting: Adult Health

## 2023-04-12 ENCOUNTER — Encounter: Payer: Self-pay | Admitting: Adult Health

## 2023-04-12 ENCOUNTER — Telehealth: Payer: Self-pay | Admitting: Adult Health

## 2023-04-12 DIAGNOSIS — R454 Irritability and anger: Secondary | ICD-10-CM

## 2023-04-12 DIAGNOSIS — F419 Anxiety disorder, unspecified: Secondary | ICD-10-CM

## 2023-04-12 MED ORDER — BUPROPION HCL ER (XL) 300 MG PO TB24: 300.0000 mg | ORAL_TABLET | Freq: Every day | ORAL | 0 refills | Status: DC

## 2023-04-12 NOTE — Telephone Encounter (Signed)
Prescription Request  04/12/2023  LOV: 03/13/2023  What is the name of the medication or equipment?  buPROPion (WELLBUTRIN XL) 300 MG 24 hr tablet  Pt has only 3 pills left. Pt is scheduled to come in on 04/24/23. Please advise.   Have you contacted your pharmacy to request a refill? No   Which pharmacy would you like this sent to?  [id number] - [place], [place] - [address] AT [facility] OF [address] [place] [address] Phone: [phone] Fax: [phone]    Patient notified that their request is being sent to the clinical staff for review and that they should receive a response within 2 business days.   Please advise at Mobile [phone] (mobile)

## 2023-04-23 LAB — DRUG SCREEN, URINE WITH CONFIRMATION
Alcohol, Urine: NEGATIVE MG/DL (ref <10)
Amphetamine, Urine: NEGATIVE NG/ML (ref 500–?)
Barbiturates, Urine: NEGATIVE NG/ML (ref 300–?)
Benzodiazepines, Urine: NEGATIVE NG/ML (ref 100–?)
Cocaine Metabolite, Urine: NEGATIVE NG/ML (ref 150–?)
Creatinine, Ur: 236.51 MG/DL (ref 20.00–320.0)
Heroin Metabolite, Urine: NEGATIVE NG/ML (ref <10)
Marijuana Metab, Ur: POSITIVE NG/ML — ABNORMAL HIGH (ref <20)
Methadone Metabolite, UR: NEGATIVE NG/ML (ref 100–?)
Opiates, Urine: NEGATIVE NG/ML (ref 100–?)
Oxycodone Urine: NEGATIVE NG/ML (ref 100–?)
Phencyclidine, Urine: NEGATIVE NG/ML (ref <25)
pH, Urine: 8

## 2023-04-23 LAB — HEMOGLOBIN A1C W/EAG
Estimated Avg Glucose: 103 MG/DL
Hemoglobin A1C: 5.2 %

## 2023-04-24 ENCOUNTER — Ambulatory Visit (INDEPENDENT_AMBULATORY_CARE_PROVIDER_SITE_OTHER): Payer: No Typology Code available for payment source | Admitting: Adult Health

## 2023-04-24 VITALS — BP 110/60 | HR 59 | Temp 98.1°F | Ht 65.0 in | Wt 232.0 lb

## 2023-04-24 DIAGNOSIS — Z114 Encounter for screening for human immunodeficiency virus [HIV]: Secondary | ICD-10-CM | POA: Diagnosis not present

## 2023-04-24 DIAGNOSIS — R232 Flushing: Secondary | ICD-10-CM

## 2023-04-24 DIAGNOSIS — E559 Vitamin D deficiency, unspecified: Secondary | ICD-10-CM | POA: Diagnosis not present

## 2023-04-24 DIAGNOSIS — F419 Anxiety disorder, unspecified: Secondary | ICD-10-CM | POA: Diagnosis not present

## 2023-04-24 DIAGNOSIS — F32A Depression, unspecified: Secondary | ICD-10-CM

## 2023-04-24 LAB — COMPREHENSIVE METABOLIC PANEL
ALT: 28 U/L (ref 0–35)
AST: 23 U/L (ref 0–37)
Albumin: 4.3 g/dL (ref 3.5–5.2)
Alkaline Phosphatase: 57 U/L (ref 39–117)
BUN: 10 mg/dL (ref 6–23)
CO2: 25 mEq/L (ref 19–32)
Calcium: 9.1 mg/dL (ref 8.4–10.5)
Chloride: 104 mEq/L (ref 96–112)
Creatinine, Ser: 0.67 mg/dL (ref 0.40–1.20)
GFR: 114.13 mL/min (ref >60.00)
Glucose, Bld: 79 mg/dL (ref 70–99)
Potassium: 4.1 mEq/L (ref 3.5–5.1)
Sodium: 138 mEq/L (ref 135–145)
Total Bilirubin: 1.3 mg/dL — ABNORMAL HIGH (ref 0.2–1.2)
Total Protein: 7.1 g/dL (ref 6.0–8.3)

## 2023-04-24 LAB — CBC
HCT: 43.4 % (ref 36.0–46.0)
Hemoglobin: 14.7 g/dL (ref 12.0–15.0)
MCHC: 33.8 g/dL (ref 30.0–36.0)
MCV: 87.9 fl (ref 78.0–100.0)
Platelets: 235 10*3/uL (ref 150.0–400.0)
RBC: 4.94 Mil/uL (ref 3.87–5.11)
RDW: 13.2 % (ref 11.5–15.5)
WBC: 5.4 10*3/uL (ref 4.0–10.5)

## 2023-04-24 LAB — FOLLICLE STIMULATING HORMONE: FSH: 5 m[IU]/mL

## 2023-04-24 LAB — TESTOSTERONE: Testosterone: 46.63 ng/dL — ABNORMAL HIGH (ref 15.00–40.00)

## 2023-04-24 LAB — T3, FREE: T3, Free: 3 pg/mL (ref 2.3–4.2)

## 2023-04-24 LAB — VITAMIN D 25 HYDROXY (VIT D DEFICIENCY, FRACTURES): VITD: 38.84 ng/mL (ref 30.00–100.00)

## 2023-04-24 LAB — LUTEINIZING HORMONE: LH: 6.31 m[IU]/mL

## 2023-04-24 LAB — TSH: TSH: 0.95 u[IU]/mL (ref 0.35–5.50)

## 2023-04-24 MED ORDER — BUPROPION HCL ER (XL) 300 MG PO TB24: 300.0000 mg | ORAL_TABLET | Freq: Every day | ORAL | 1 refills | Status: DC

## 2023-04-24 NOTE — Progress Notes (Signed)
Subjective:    Patient ID: Yvette Hart, female    DOB: Feb 06, 1991, 32 y.o.   MRN: [id number]  HPI 32 year old female who  has a past medical history of Anxiety, Back pain, Breast discharge, Cardiomyopathy (HCC), Chest pain, Chicken pox, Constipation, Depression, Fatty liver, Food allergy, Gallbladder problem, GERD (gastroesophageal reflux disease), Heart valve problem, Hepatic artery stenosis (HCC), Hip pain, History of swelling of feet, Hyperlipidemia, IBS (irritable bowel syndrome), Joint pain, Lactose intolerance, Neck pain, Obesity, Osteoarthritis, Palpitations, PCOS (polycystic ovarian syndrome), SOB (shortness of breath), Stomach ulcer, SVD (spontaneous vaginal delivery) (01/26/2018), Swallowing difficulty, Urinary incontinence, and Vitamin D deficiency.  She presents to the office today for 1 month follow-up regarding anxiety and depression.  2 months ago she was started on Wellbutrin 150 mg and at 1 month follow-up she did have improvement in her symptoms but symptoms were still present.  At this time we increased her Wellbutrin to 300 mg extended release daily.  PHQ 9 = 22>>>13  GAD 7 = 16 >>> 14  Since increasing her Wellbutrin she does " feel better".She continues to have days when she crys and feels sad but overall she feels much better.  She has also been working on diet.   She is also having hot flashes and her GYN would like for [place] to do blood work on her today as the patient cannot get over there to see her.    She would also like for me to take over her Vitamin D that was prescribed by the weight loss clinic. She has not taken it in multiple months.    Review of Systems See HPI   Past Medical History:  Diagnosis Date   Anxiety    Back pain    Breast discharge    Cardiomyopathy (HCC)    monitored by [facility] medical [place] pt bringing records   Chest pain    Chicken pox    Constipation    Depression    Fatty liver    Food allergy    Gallbladder problem     GERD (gastroesophageal reflux disease)    Heart valve problem    Hepatic artery stenosis (HCC)    Hip pain    History of swelling of feet    Hyperlipidemia    IBS (irritable bowel syndrome)    Joint pain    Lactose intolerance    Neck pain    Obesity    Osteoarthritis    Palpitations    PCOS (polycystic ovarian syndrome)    SOB (shortness of breath)    Stomach ulcer    SVD (spontaneous vaginal delivery) 01/26/2018   Swallowing difficulty    Urinary incontinence    Vitamin D deficiency     Social History   Socioeconomic History   Marital status: Married    Spouse name: Not on file   Number of children: Not on file   Years of education: Not on file   Highest education level: Associate degree: occupational, Scientist, product/process development, or vocational program  Occupational History   Not on file  Tobacco Use   Smoking status: Never   Smokeless tobacco: Never  Vaping Use   Vaping Use: Never used  Substance and Sexual Activity   Alcohol use: Yes    Alcohol/week: 3.0 standard drinks of alcohol    Types: 1 Glasses of wine, 1 Cans of beer, 1 Shots of liquor per week    Comment: social ONCE A MONTH   Drug use: No  Sexual activity: Yes    Birth control/protection: None  Other Topics Concern   Not on file  Social History Narrative   Not on file   Social Determinants of Health   Financial Resource Strain: Medium Risk (04/24/2023)   Overall Financial Resource Strain (CARDIA)    Difficulty of Paying Living Expenses: Somewhat hard  Food Insecurity: No Food Insecurity (04/24/2023)   Hunger Vital Sign    Worried About Running Out of Food in the Last Year: Never true    Ran Out of Food in the Last Year: Never true  Transportation Needs: No Transportation Needs (04/24/2023)   PRAPARE - Administrator, Civil Service (Medical): No    Lack of Transportation (Non-Medical): No  Physical Activity: Insufficiently Active (04/24/2023)   Exercise Vital Sign    Days of Exercise per Week: 1 day     Minutes of Exercise per Session: 20 min  Stress: No Stress Concern Present (04/24/2023)   [employer] of Occupational Health - Occupational Stress Questionnaire    Feeling of Stress : Only a little  Social Connections: Moderately Integrated (04/24/2023)   Social Connection and Isolation Panel [NHANES]    Frequency of Communication with Friends and Family: Twice a week    Frequency of Social Gatherings with Friends and Family: Once a week    Attends Religious Services: More than 4 times per year    Active Member of [employer] or Organizations: No    Attends Engineer, structural: Not on file    Marital Status: Married  Catering manager Violence: Not on file    Past Surgical History:  Procedure Laterality Date   NO PAST SURGERIES      Family History  Problem Relation Age of Onset   Depression Mother    Miscarriages / Stillbirths Mother        TWICE   Cancer Mother    Hypertension Father    Cancer Father    Mental illness Sister    Depression Sister    Asthma Brother    Depression Brother    Cervical cancer Paternal Aunt    Ovarian cancer Paternal Aunt    Stroke Maternal Grandmother    Heart disease Paternal Grandmother    Breast cancer Paternal Grandmother    Diabetes Paternal Grandmother    COPD Paternal Grandmother    Heart attack Paternal Grandmother    Hypertension Paternal Grandmother    Colon polyps Paternal Grandfather    Colon cancer Paternal Grandfather    Crohn's disease Cousin    Esophageal cancer Neg Hx    Pancreatic cancer Neg Hx    Stomach cancer Neg Hx     Allergies  Allergen Reactions   Latex Rash   Onion Nausea And Vomiting and Other (See Comments)    Upset stomache    Current Outpatient Medications on File Prior to Visit  Medication Sig Dispense Refill   acetaminophen (TYLENOL) 500 MG tablet Take 500-1,000 mg by mouth every 6 (six) hours as needed for mild pain or headache.     buPROPion (WELLBUTRIN XL) 300 MG 24 hr tablet Take 1  tablet (300 mg total) by mouth daily. 30 tablet 0   cholecalciferol (VITAMIN D3) 25 MCG (1000 UNIT) tablet Take 1,000 Units by mouth daily.     levonorgestrel (MIRENA, 52 MG,) 20 MCG/DAY IUD Take 1 device by intrauterine route.     metFORMIN (GLUCOPHAGE) 500 MG tablet Take 1 tablet (500 mg total) by mouth 2 (two) times daily  with a meal. 60 tablet 0   Vitamin D, Ergocalciferol, (DRISDOL) 1.25 MG (50000 UNIT) CAPS capsule Take 1 capsule (50,000 Units total) by mouth every 7 (seven) days. 12 capsule 0   No current facility-administered medications on file prior to visit.    BP 110/60   Pulse (!) 59   Temp 98.1 F (36.7 C) (Oral)   Ht 5\' 5"  (1.651 m)   Wt 232 lb (105.2 kg)   SpO2 94%   BMI 38.61 kg/m       Objective:   Physical Exam Vitals and nursing note reviewed.  Constitutional:      Appearance: Normal appearance. She is obese.  Cardiovascular:     Rate and Rhythm: Normal rate and regular rhythm.     Pulses: Normal pulses.     Heart sounds: Normal heart sounds.  Skin:    General: Skin is warm and dry.  Neurological:     General: No focal deficit present.     Mental Status: She is alert and oriented to person, place, and time.  Psychiatric:        Mood and Affect: Mood normal.        Behavior: Behavior normal.        Thought Content: Thought content normal.        Judgment: Judgment normal.        Assessment & Plan:  1. Anxiety and depression    04/24/2023    9:21 AM 03/13/2023   10:53 AM 02/13/2023    2:02 PM  GAD 7 : Generalized Anxiety Score  Nervous, Anxious, on Edge 0 2 3  Control/stop worrying 0 3 3  Worry too much - different things 0 3 3  Trouble relaxing 0 2 2  Restless 0 0 1  Easily annoyed or irritable 1 3 3   Afraid - awful might happen 0 1 1  Total GAD 7 Score 1 14 16   Anxiety Difficulty Not difficult at all Somewhat difficult Very difficult    Flowsheet Row Office Visit from 04/24/2023 in [facility] [place]  PHQ-9  Total Score 3      She has made tremendous improvements.  - Will continue on Wellbutrin 300 mg XR daily  - Follow up as needed - buPROPion (WELLBUTRIN XL) 300 MG 24 hr tablet; Take 1 tablet (300 mg total) by mouth daily.  Dispense: 90 tablet; Refill: 1  2. Vitamin D deficiency  - VITAMIN D 25 Hydroxy (Vit-D Deficiency, Fractures); Future - VITAMIN D 25 Hydroxy (Vit-D Deficiency, Fractures)  3. Hot flashes - She will forward labs to her GYN  - Follicle Stimulating Hormone; Future - Luteinizing Hormone; Future - TSH; Future - T3, Free; Future - Progesterone; Future - Testosterone; Future - CBC; Future - Comprehensive metabolic panel; Future - Comprehensive metabolic panel - CBC - Testosterone - Progesterone - T3, Free - TSH - Luteinizing Hormone - Follicle Stimulating Hormone  4. Encounter for screening for HIV  - HIV Antibody (routine testing w rflx); Future - HIV Antibody (routine testing w rflx)  Shirline Frees, NP

## 2023-04-25 LAB — HIV ANTIBODY (ROUTINE TESTING W REFLEX): HIV 1&2 Ab, 4th Generation: NONREACTIVE

## 2023-04-25 LAB — PROGESTERONE: Progesterone: 5.7 ng/mL

## 2023-05-29 ENCOUNTER — Encounter (INDEPENDENT_AMBULATORY_CARE_PROVIDER_SITE_OTHER): Payer: Self-pay

## 2023-06-19 ENCOUNTER — Ambulatory Visit (INDEPENDENT_AMBULATORY_CARE_PROVIDER_SITE_OTHER): Payer: No Typology Code available for payment source | Admitting: Adult Health

## 2023-06-19 ENCOUNTER — Encounter: Payer: Self-pay | Admitting: Adult Health

## 2023-06-19 VITALS — BP 110/72 | HR 70 | Temp 98.4°F | Ht 65.0 in | Wt 232.0 lb

## 2023-06-19 DIAGNOSIS — F32A Depression, unspecified: Secondary | ICD-10-CM

## 2023-06-19 DIAGNOSIS — Z1159 Encounter for screening for other viral diseases: Secondary | ICD-10-CM

## 2023-06-19 DIAGNOSIS — Z0000 Encounter for general adult medical examination without abnormal findings: Secondary | ICD-10-CM | POA: Diagnosis not present

## 2023-06-19 DIAGNOSIS — I429 Cardiomyopathy, unspecified: Secondary | ICD-10-CM

## 2023-06-19 DIAGNOSIS — F419 Anxiety disorder, unspecified: Secondary | ICD-10-CM | POA: Diagnosis not present

## 2023-06-19 DIAGNOSIS — E66813 Obesity, class 3: Secondary | ICD-10-CM

## 2023-06-19 DIAGNOSIS — Z6841 Body Mass Index (BMI) 40.0 and over, adult: Secondary | ICD-10-CM | POA: Diagnosis not present

## 2023-06-19 DIAGNOSIS — R5383 Other fatigue: Secondary | ICD-10-CM

## 2023-06-19 LAB — LIPID PANEL
Cholesterol: 184 mg/dL (ref 0–200)
HDL: 46.8 mg/dL (ref >39.00)
LDL Cholesterol: 123 mg/dL — ABNORMAL HIGH (ref 0–99)
NonHDL: 137.66
Total CHOL/HDL Ratio: 4
Triglycerides: 75 mg/dL (ref 0.0–149.0)
VLDL: 15 mg/dL (ref 0.0–40.0)

## 2023-06-19 NOTE — Progress Notes (Signed)
Subjective:    Patient ID: Yvette Hart, female    DOB: 1991-09-29, 32 y.o.   MRN: [id number]  HPI Patient presents for yearly preventative medicine examination. She is a pleasant 32 year old female who  has a past medical history of Anxiety, Back pain, Breast discharge, Cardiomyopathy (HCC), Chest pain, Chicken pox, Constipation, Depression, Fatty liver, Food allergy, Gallbladder problem, GERD (gastroesophageal reflux disease), Heart valve problem, Hepatic artery stenosis (HCC), Hip pain, History of swelling of feet, Hyperlipidemia, IBS (irritable bowel syndrome), Joint pain, Lactose intolerance, Neck pain, Obesity, Osteoarthritis, Palpitations, PCOS (polycystic ovarian syndrome), SOB (shortness of breath), Stomach ulcer, SVD (spontaneous vaginal delivery) (01/26/2018), Swallowing difficulty, Urinary incontinence, and Vitamin D deficiency.  Anxiety and Depression - Takes Wellbutrin 300 mg XR. This has been working for her but reports that she has a lot of stress right now and her mood has been up and down.     06/19/2023    8:35 AM 04/24/2023    9:21 AM 03/13/2023   10:43 AM  PHQ9 SCORE ONLY  PHQ-9 Total Score 10 3 13       06/19/2023    8:36 AM 04/24/2023    9:21 AM 03/13/2023   10:53 AM 02/13/2023    2:02 PM  GAD 7 : Generalized Anxiety Score  Nervous, Anxious, on Edge 2 0 2 3  Control/stop worrying 2 0 3 3  Worry too much - different things 3 0 3 3  Trouble relaxing 1 0 2 2  Restless 1 0 0 1  Easily annoyed or irritable 3 1 3 3   Afraid - awful might happen 3 0 1 1  Total GAD 7 Score 15 1 14 16   Anxiety Difficulty Not difficult at all Not difficult at all Somewhat difficult Very difficult    Obesity - has had trouble with weight loss in the past. She has tried the weight loss clinic at [facility]. Has been tried on Topamax, Victoza, and Phentermine with minimal results.  She has not been able to tolerate phentermine as it causes nausea  Fatigue - feels as though her energy level is low.  She does report snoring and waking up feeling fatigued. She does not have apneic episodes. She feels like she could take a nap in the middle of the afternoon.   History of Cardiomyopathy - she was seen by [facility] in [facility]. She would like to transfer care to Cardiology through [facility]. She denies chest pain, lower extremity edema, or SOB. She does have occasional palpitations.   All immunizations and health maintenance protocols were reviewed with the patient and needed orders were placed.  Appropriate screening laboratory values were ordered for the patient including screening of hyperlipidemia, renal function and hepatic function.  Medication reconciliation,  past medical history, social history, problem list and allergies were reviewed in detail with the patient  Goals were established with regard to weight loss, exercise, and  diet in compliance with medications Wt Readings from Last 3 Encounters:  06/19/23 232 lb (105.2 kg)  04/24/23 232 lb (105.2 kg)  03/13/23 240 lb (108.9 kg)    Review of Systems  Constitutional: Negative.   HENT: Negative.    Eyes: Negative.   Respiratory: Negative.    Cardiovascular: Negative.   Gastrointestinal: Negative.   Endocrine: Negative.   Genitourinary: Negative.   Musculoskeletal:  Positive for arthralgias.  Skin: Negative.   Allergic/Immunologic: Negative.   Neurological: Negative.   Hematological: Negative.   Psychiatric/Behavioral:  Positive for dysphoric mood and  sleep disturbance. The patient is nervous/anxious.    Past Medical History:  Diagnosis Date   Anxiety    Back pain    Breast discharge    Cardiomyopathy (HCC)    monitored by [facility] medical [place] pt bringing records   Chest pain    Chicken pox    Constipation    Depression    Fatty liver    Food allergy    Gallbladder problem    GERD (gastroesophageal reflux disease)    Heart valve problem    Hepatic artery stenosis (HCC)    Hip pain    History of  swelling of feet    Hyperlipidemia    IBS (irritable bowel syndrome)    Joint pain    Lactose intolerance    Neck pain    Obesity    Osteoarthritis    Palpitations    PCOS (polycystic ovarian syndrome)    SOB (shortness of breath)    Stomach ulcer    SVD (spontaneous vaginal delivery) 01/26/2018   Swallowing difficulty    Urinary incontinence    Vitamin D deficiency     Social History   Socioeconomic History   Marital status: Married    Spouse name: Not on file   Number of children: Not on file   Years of education: Not on file   Highest education level: Associate degree: occupational, Scientist, product/process development, or vocational program  Occupational History   Not on file  Tobacco Use   Smoking status: Never   Smokeless tobacco: Never  Vaping Use   Vaping status: Never Used  Substance and Sexual Activity   Alcohol use: Yes    Alcohol/week: 3.0 standard drinks of alcohol    Types: 1 Glasses of wine, 1 Cans of beer, 1 Shots of liquor per week    Comment: social ONCE A MONTH   Drug use: No   Sexual activity: Yes    Birth control/protection: None  Other Topics Concern   Not on file  Social History Narrative   Not on file   Social Determinants of Health   Financial Resource Strain: Medium Risk (04/24/2023)   Overall Financial Resource Strain (CARDIA)    Difficulty of Paying Living Expenses: Somewhat hard  Food Insecurity: No Food Insecurity (04/24/2023)   Hunger Vital Sign    Worried About Running Out of Food in the Last Year: Never true    Ran Out of Food in the Last Year: Never true  Transportation Needs: No Transportation Needs (04/24/2023)   PRAPARE - Administrator, Civil Service (Medical): No    Lack of Transportation (Non-Medical): No  Physical Activity: Insufficiently Active (04/24/2023)   Exercise Vital Sign    Days of Exercise per Week: 1 day    Minutes of Exercise per Session: 20 min  Stress: No Stress Concern Present (04/24/2023)   [employer] of  Occupational Health - Occupational Stress Questionnaire    Feeling of Stress : Only a little  Social Connections: Moderately Integrated (04/24/2023)   Social Connection and Isolation Panel [NHANES]    Frequency of Communication with Friends and Family: Twice a week    Frequency of Social Gatherings with Friends and Family: Once a week    Attends Religious Services: More than 4 times per year    Active Member of [employer] or Organizations: No    Attends Engineer, structural: Not on file    Marital Status: Married  Catering manager Violence: Not on file  Past Surgical History:  Procedure Laterality Date   NO PAST SURGERIES      Family History  Problem Relation Age of Onset   Depression Mother    Miscarriages / [place] Mother        TWICE   Cancer Mother    Hypertension Father    Cancer Father    Mental illness Sister    Depression Sister    Asthma Brother    Depression Brother    Cervical cancer Paternal Aunt    Ovarian cancer Paternal Aunt    Stroke Maternal Grandmother    Heart disease Paternal Grandmother    Breast cancer Paternal Grandmother    Diabetes Paternal Grandmother    COPD Paternal Grandmother    Heart attack Paternal Grandmother    Hypertension Paternal Grandmother    Colon polyps Paternal Grandfather    Colon cancer Paternal Grandfather    Crohn's disease Cousin    Esophageal cancer Neg Hx    Pancreatic cancer Neg Hx    Stomach cancer Neg Hx     Allergies  Allergen Reactions   Latex Rash   Onion Nausea And Vomiting and Other (See Comments)    Upset stomache    Current Outpatient Medications on File Prior to Visit  Medication Sig Dispense Refill   acetaminophen (TYLENOL) 500 MG tablet Take 500-1,000 mg by mouth every 6 (six) hours as needed for mild pain or headache.     buPROPion (WELLBUTRIN XL) 300 MG 24 hr tablet Take 1 tablet (300 mg total) by mouth daily. 90 tablet 1   cholecalciferol (VITAMIN D3) 25 MCG (1000 UNIT) tablet Take  1,000 Units by mouth daily.     docusate (COLACE) 50 MG/5ML liquid Take by mouth daily.     lactobacillus acidophilus (BACID) TABS tablet Take 2 tablets by mouth 3 (three) times daily.     levonorgestrel (MIRENA, 52 MG,) 20 MCG/DAY IUD Take 1 device by intrauterine route.     metFORMIN (GLUCOPHAGE) 500 MG tablet Take 1 tablet (500 mg total) by mouth 2 (two) times daily with a meal. 60 tablet 0   No current facility-administered medications on file prior to visit.    BP 110/72   Pulse 70   Temp 98.4 F (36.9 C) (Oral)   Ht 5\' 5"  (1.651 m)   Wt 232 lb (105.2 kg)   SpO2 98%   BMI 38.61 kg/m       Objective:   Physical Exam Vitals and nursing note reviewed.  Constitutional:      General: She is not in acute distress.    Appearance: Normal appearance. She is obese. She is not ill-appearing.  HENT:     Head: Normocephalic and atraumatic.     Right Ear: Tympanic membrane, ear canal and external ear normal. There is no impacted cerumen.     Left Ear: Tympanic membrane, ear canal and external ear normal. There is no impacted cerumen.     Nose: Nose normal. No congestion or rhinorrhea.     Mouth/Throat:     Mouth: Mucous membranes are moist.     Pharynx: Oropharynx is clear.  Eyes:     Extraocular Movements: Extraocular movements intact.     Conjunctiva/sclera: Conjunctivae normal.     Pupils: Pupils are equal, round, and reactive to light.  Neck:     Vascular: No carotid bruit.  Cardiovascular:     Rate and Rhythm: Normal rate and regular rhythm.     Pulses: Normal pulses.  Heart sounds: No murmur heard.    No friction rub. No gallop.  Pulmonary:     Effort: Pulmonary effort is normal.     Breath sounds: Normal breath sounds.  Abdominal:     General: Abdomen is flat. Bowel sounds are normal. There is no distension.     Palpations: Abdomen is soft. There is no mass.     Tenderness: There is no abdominal tenderness. There is no guarding or rebound.     Hernia: No hernia  is present.  Musculoskeletal:        General: Normal range of motion.     Cervical back: Normal range of motion and neck supple.  Lymphadenopathy:     Cervical: No cervical adenopathy.  Skin:    General: Skin is warm and dry.     Capillary Refill: Capillary refill takes less than 2 seconds.  Neurological:     General: No focal deficit present.     Mental Status: She is alert and oriented to person, place, and time.  Psychiatric:        Mood and Affect: Mood normal.        Behavior: Behavior normal.        Thought Content: Thought content normal.        Judgment: Judgment normal.        Assessment & Plan:  1. Routine general medical examination at a health care facility Today patient counseled on age appropriate routine health concerns for screening and prevention, each reviewed and up to date or declined. Immunizations reviewed and up to date or declined. Labs ordered and reviewed. Risk factors for depression reviewed and negative. Hearing function and visual acuity are intact. ADLs screened and addressed as needed. Functional ability and level of safety reviewed and appropriate. Education, counseling and referrals performed based on assessed risks today. Patient provided with a copy of personalized plan for preventive services. - We checked a majority of her labs about 2 months ago and there is no reason to repeat at this time  - Encouraged lifestyle modifications  - Follow up in one year or sooner if needed  2. Anxiety and depression - Continue Wellbutrin.  - She is not ready to see psychiatry yet   3. Obesity,current BMI 39.9 - Lifestyle modifications - She is ready to go back to weight loss clinic  - Lipid panel; Future  4. Need for hepatitis C screening test  - Hep C Antibody; Future  5. Other fatigue - Concern for a degree of sleep apnea  - Ambulatory referral to Pulmonology  6. Cardiomyopathy, unspecified type [facility])  - Ambulatory referral to Cardiology  Shirline Frees, NP

## 2023-06-20 LAB — HEPATITIS C ANTIBODY: Hepatitis C Ab: NONREACTIVE

## 2023-07-19 ENCOUNTER — Encounter: Payer: Self-pay | Admitting: Adult Health

## 2023-07-19 NOTE — Telephone Encounter (Signed)
There are no available appointments in the office. Ok to advise urgent care? Please advise

## 2023-08-27 NOTE — Progress Notes (Unsigned)
Cardiology Office Note:  .   Date:  08/29/2023  ID:  Yvette Hart, DOB Feb 19, 1991, MRN [id number] PCP: Shirline Frees, NP  Cardiologist: Arnette Felts, PA [facility] Providers Cardiologist:  Bryan Lemma, MD     Chief Complaint  Patient presents with   New Patient (Initial Visit)   Cardiomyopathy    Previously Dx with ? HCM    Patient Profile: .     Yvette Hart is an obese 32 y.o. female non-smoker previously followed at [facility] by Roxanne Mins, PA) with a PMH notable for Hyperlipidemia, IBS and GERD as well as GAD who presents here for Evaluation of Shortness of Breath at the request of Shirline Frees, NP.    Yvette Hart was last seen on by Mr. Duran on October 19, 2017 for a 32-month follow-up..  They were checking her lipids.  She was doing well.  Had adjusted her dietary intake.  Will try to exercise but have not gotten into any program yet.  Is having some nausea due to pregnancy.  Subjective  Discussed the use of AI scribe software for clinical note transcription with the patient, who gave verbal consent to proceed.  History of Present Illness   The patient, previously under the care of Arnette Felts, PApresents for reestablishment of care following Mr. Duran's passing. 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 The diagnosis was managed with annual monitoring, with the last echocardiogram performed approximately six years ago. Currently, the patient reports no chest pain, attributing any discomfort to gas. She does, however, experience occasional shortness of breath, which she believes may be due to GERD.  In addition to the cardiomyopathy, the patient has a history of GERD and PCOS, for which she takes metformin. She also takes Wellbutrin, and has a Mirena IUD. She denies any current use of vitamin D, Colace, or Tylenol.  The patient reports occasional palpitations, described as a few  skipped beats followed by a jolt, which sometimes prompts a cough. She denies any prolonged episodes, dizziness, or syncope. She also denies any exertional chest pain or dyspnea, but does note that she becomes hot and coughs when active.  The patient has a family history of diabetes in a maternal grandmother, hypertension in her father, and a heart murmur in her mother. She has one child, aged 52.  Recent blood work, performed in August, showed a total cholesterol of 184, triglycerides of 75, HDL of 46, and LDL of 123. Her A1c was 4.9, and kidney function was normal with a creatinine of 0.67. Hemoglobin was 14.7, indicating no anemia, and potassium was 4.1.  The patient is scheduled for a sleep apnea evaluation next month. She also reports struggling with weight loss, despite being on metformin for this purpose. She expresses a desire to lose weight to improve her shortness of breath and overall health.      Cardiovascular ROS: no chest pain or dyspnea on exertion positive for - irregular heartbeat, palpitations, and rapid heart rate negative for - edema, orthopnea, paroxysmal nocturnal dyspnea, rapid heart rate, shortness of breath, or syncope/near syncope or TIA/CVA/amaurosis fugax; claudication ROS:  Review of Systems - Negative except symptoms noted in HPI.   Objective   PMH: GAD, GERD, IBS, hyperlipidemia, obesity, mild LVH. Family History: Father with HTN, mother had irregular heartbeat, MGM with DM-2, sister with depression Social History: Married.  Non-smoker.  Associates degree from technical program 3 servings of alcohol per  week.  Current Meds  Medication Sig   buPROPion (WELLBUTRIN XL) 300 MG 24 hr tablet Take 1 tablet (300 mg total) by mouth daily.   lactobacillus acidophilus (BACID) TABS tablet Take 2 tablets by mouth 3 (three) times daily.   levonorgestrel (MIRENA, 52 MG,) 20 MCG/DAY IUD Take 1 device by intrauterine route.   metFORMIN (GLUCOPHAGE) 500 MG tablet Take 1  tablet (500 mg total) by mouth 2 (two) times daily with a meal.   Studies Reviewed: Marland Kitchen   EKG Interpretation Date/Time:  Wednesday August 28 2023 09:37:43 EDT Ventricular Rate:  62 PR Interval:  142 QRS Duration:  90 QT Interval:  394 QTC Calculation: 399 R Axis:   72  Text Interpretation: Normal sinus rhythm Normal ECG No previous ECGs available Confirmed by Bryan Lemma ([id number]) on 08/28/2023 9:57:34 AM    ECHO November 2018 [facility]): Normal LV size and function with a EF of 55 to 60%.  Mild concentric LVH.  Mild LA dilation.  Otherwise normal. PFTs February 2018: FEV1 1.1 (33%, FEV six 3.2, FEV1/FVC 7036.1.  Lab Results  Component Value Date   CHOL 184 06/19/2023   HDL 46.80 06/19/2023   LDLCALC 123 (H) 06/19/2023   TRIG 75.0 06/19/2023   CHOLHDL 4 06/19/2023   Lab Results  Component Value Date   NA 138 04/24/2023   K 4.1 04/24/2023   CREATININE 0.67 04/24/2023   GFR 114.13 04/24/2023   GLUCOSE 79 04/24/2023   Lab Results  Component Value Date   HGBA1C 4.9 08/22/2022   Lab Results  Component Value Date   TSH 0.95 04/24/2023   Risk Assessment/Calculations:            Physical Exam:   VS:  BP 120/86 (BP Location: Left Arm, Patient Position: Sitting, Cuff Size: Large)   Pulse 62   Ht 5\' 5"  (1.651 m)   Wt 236 lb 3.2 oz (107.1 kg)   SpO2 97%   BMI 39.31 kg/m    Wt Readings from Last 3 Encounters:  08/28/23 236 lb 3.2 oz (107.1 kg)  06/19/23 232 lb (105.2 kg)  04/24/23 232 lb (105.2 kg)    GEN: Well nourished, well developed in no acute distress; borderline morbidly obese but otherwise healthy-appearing NECK: No JVD; No carotid bruits CARDIAC: Normal S1, S2; RRR, no murmurs, rubs, gallops RESPIRATORY:  Clear to auscultation without rales, wheezing or rhonchi ; nonlabored, good air movement. ABDOMEN: Soft, non-tender, non-distended EXTREMITIES:  No edema; No deformity     ASSESSMENT AND PLAN: .    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  More consistent with mild LVH. Last echocardiogram 6 years ago showed mild LVH with normal ejection fraction and mild lateral dilation. No current symptoms of chest pain or significant shortness of breath. -Order echocardiogram to reassess cardiac structure and function.      Relevant Orders   ECHOCARDIOGRAM COMPLETE   Other hyperlipidemia (Chronic)   Relevant Orders   EKG 12-Lead (Completed)     Other   Class 3 severe obesity without serious comorbidity with body mass index (BMI) of 40.0 to 44.9 in adult [facility]) - Primary (Chronic)    Struggling with weight loss, which may be contributing to shortness of breath. -Encourage continued efforts for weight loss.      Relevant Orders  EKG 12-Lead (Completed)   SOB (shortness of breath)    Intermittent.  History of mild LVH.  Will reassess with 2D echo      Relevant Orders   EKG 12-Lead (Completed)   ECHOCARDIOGRAM COMPLETE      Follow-Up: Return in about 3 months (around 11/28/2023). Plan to review results of echocardiogram and assess overall health status in early part of next year.   Total time spent: 18 min spent with patient + 13 min spent charting = 31 min    Signed, Marykay Lex, MD, MS Bryan Lemma, M.D., M.S. Interventional Cardiologist  [facility]  Pager # [phone] Phone # [phone] [address] [place], [place] [address]

## 2023-08-28 ENCOUNTER — Ambulatory Visit: Payer: No Typology Code available for payment source | Attending: Cardiology | Admitting: Cardiology

## 2023-08-28 ENCOUNTER — Encounter: Payer: Self-pay | Admitting: Cardiology

## 2023-08-28 VITALS — BP 120/86 | HR 62 | Ht 65.0 in | Wt 236.2 lb

## 2023-08-28 DIAGNOSIS — R0602 Shortness of breath: Secondary | ICD-10-CM

## 2023-08-28 DIAGNOSIS — Z6841 Body Mass Index (BMI) 40.0 and over, adult: Secondary | ICD-10-CM

## 2023-08-28 DIAGNOSIS — I517 Cardiomegaly: Secondary | ICD-10-CM | POA: Insufficient documentation

## 2023-08-28 DIAGNOSIS — E7849 Other hyperlipidemia: Secondary | ICD-10-CM

## 2023-08-28 DIAGNOSIS — E66813 Obesity, class 3: Secondary | ICD-10-CM

## 2023-08-28 NOTE — Patient Instructions (Addendum)
Medication Instructions:  No changes *If you need a refill on your cardiac medications before your next appointment, please call your pharmacy*   Lab Work: Not needed If you have labs (blood work) drawn today and your tests are completely normal, you will receive your results only by: MyChart Message (if you have MyChart) OR A paper copy in the mail If you have any lab test that is abnormal or we need to change your treatment, we will call you to review the results.   Testing/Procedures: Will be schedule at [facility] [address] Your physician has requested that you have an echocardiogram. Echocardiography is a painless test that uses sound waves to create images of your heart. It provides your doctor with information about the size and shape of your heart and how well your heart's chambers and valves are working. This procedure takes approximately one hour. There are no restrictions for this procedure. Please do NOT wear cologne, perfume, aftershave, or lotions (deodorant is allowed). Please arrive 15 minutes prior to your appointment time.    Follow-Up: At [facility], you and your health needs are our priority.  As part of our continuing mission to provide you with exceptional heart care, we have created designated Provider Care Teams.  These Care Teams include your primary Cardiologist (physician) and Advanced Practice Providers (APPs -  Physician Assistants and Nurse Practitioners) who all work together to provide you with the care you need, when you need it.     Your next appointment:   3 month(s)  The format for your next appointment:   In Person  Provider:   Bryan Lemma, MD

## 2023-08-29 NOTE — Assessment & Plan Note (Signed)
Intermittent.  History of mild LVH.  Will reassess with 2D echo

## 2023-08-29 NOTE — Assessment & Plan Note (Signed)
Was given a diagnosis of hypertrophic cardiomyopathy which I think is probably false.  More consistent with mild LVH. Last echocardiogram 6 years ago showed mild LVH with normal ejection fraction and mild lateral dilation. No current symptoms of chest pain or significant shortness of breath. -Order echocardiogram to reassess cardiac structure and function.

## 2023-08-29 NOTE — Assessment & Plan Note (Signed)
Struggling with weight loss, which may be contributing to shortness of breath. -Encourage continued efforts for weight loss.

## 2023-09-15 NOTE — Progress Notes (Unsigned)
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 Some cough during night. No complex parasomnias, Nocturia x 2-3.  Admits reflux. Chronic cough x years- cough drops. HOB elevated. Would like test for asthma. Omeprazole. Pending GI appt. No sleep med, 1 cup coffee.  Some snoring. No ENT surgery. Cardiology following.    Prior to Admission medications   Medication Sig Start Date End Date Taking? Authorizing Provider  acetaminophen (TYLENOL) 500 MG tablet Take 500-1,000 mg by mouth every 6 (six) hours as needed for mild pain (pain score 1-3) or headache.   Yes [provider]  buPROPion (WELLBUTRIN XL) 300 MG 24 hr tablet Take 1 tablet (300 mg total) by mouth daily. 04/24/23  Yes Nafziger, Kandee Keen, NP  docusate (COLACE) 50 MG/5ML liquid Take by mouth daily.   Yes [provider]  lactobacillus acidophilus (BACID) TABS tablet Take 2 tablets by mouth 3 (three) times daily.   Yes [provider]  levonorgestrel (MIRENA, 52 MG,) 20 MCG/DAY IUD Take 1 device by intrauterine route. 03/10/18  Yes [provider]  metFORMIN (GLUCOPHAGE) 500 MG tablet Take 1 tablet (500 mg total) by mouth 2 (two) times daily with a meal. 11/14/22  Yes Worthy Rancher, MD  cholecalciferol (VITAMIN D3) 25 MCG (1000 UNIT) tablet Take 1,000 Units by mouth daily.    [provider]   Past Medical History:  Diagnosis Date   Anxiety    Back pain    Breast discharge    Cardiomyopathy (HCC)    monitored by [facility] medical [place] pt bringing records   Chest pain    Chicken pox    Constipation    Depression    Fatty liver    Food allergy    Gallbladder problem    GERD  (gastroesophageal reflux disease)    Heart valve problem    Hepatic artery stenosis (HCC)    Hip pain    History of swelling of feet    Hyperlipidemia    IBS (irritable bowel syndrome)    Joint pain    Lactose intolerance    Neck pain    Obesity    Osteoarthritis    Palpitations    PCOS (polycystic ovarian syndrome)    SOB (shortness of breath)    Stomach ulcer    SVD (spontaneous vaginal delivery) 01/26/2018   Swallowing difficulty    Urinary incontinence    Vitamin D deficiency    Past Surgical History:  Procedure Laterality Date   NO PAST SURGERIES     Family History  Problem Relation Age of Onset   Depression Mother    Miscarriages / Stillbirths Mother        TWICE   Cancer Mother    Hypertension Father    Cancer Father    Mental illness Sister    Depression Sister    Asthma Brother    Depression Brother    Cervical cancer Paternal Aunt    Ovarian cancer Paternal Aunt    Stroke Maternal Grandmother    Heart disease Paternal Grandmother    Breast cancer Paternal Grandmother    Diabetes Paternal Grandmother    COPD Paternal Grandmother    Heart attack Paternal Grandmother    Hypertension Paternal Grandmother  Colon polyps Paternal Grandfather    Colon cancer Paternal Grandfather    Crohn's disease Cousin    Esophageal cancer Neg Hx    Pancreatic cancer Neg Hx    Stomach cancer Neg Hx    Social History   Socioeconomic History   Marital status: Married    Spouse name: Not on file   Number of children: Not on file   Years of education: Not on file   Highest education level: Associate degree: occupational, Scientist, product/process development, or vocational program  Occupational History   Not on file  Tobacco Use   Smoking status: Never   Smokeless tobacco: Never  Vaping Use   Vaping status: Never Used  Substance and Sexual Activity   Alcohol use: Yes    Alcohol/week: 3.0 standard drinks of alcohol    Types: 1 Glasses of wine, 1 Cans of beer, 1 Shots of liquor per week     Comment: social ONCE A MONTH   Drug use: No   Sexual activity: Yes    Birth control/protection: None  Other Topics Concern   Not on file  Social History Narrative   Not on file   Social Determinants of Health   Financial Resource Strain: Medium Risk (04/24/2023)   Overall Financial Resource Strain (CARDIA)    Difficulty of Paying Living Expenses: Somewhat hard  Food Insecurity: No Food Insecurity (04/24/2023)   Hunger Vital Sign    Worried About Running Out of Food in the Last Year: Never true    Ran Out of Food in the Last Year: Never true  Transportation Needs: No Transportation Needs (04/24/2023)   PRAPARE - Administrator, Civil Service (Medical): No    Lack of Transportation (Non-Medical): No  Physical Activity: Insufficiently Active (04/24/2023)   Exercise Vital Sign    Days of Exercise per Week: 1 day    Minutes of Exercise per Session: 20 min  Stress: No Stress Concern Present (04/24/2023)   [employer] of Occupational Health - Occupational Stress Questionnaire    Feeling of Stress : Only a little  Social Connections: Moderately Integrated (04/24/2023)   Social Connection and Isolation Panel [NHANES]    Frequency of Communication with Friends and Family: Twice a week    Frequency of Social Gatherings with Friends and Family: Once a week    Attends Religious Services: More than 4 times per year    Active Member of [employer] or Organizations: No    Attends Engineer, structural: Not on file    Marital Status: Married  Catering manager Violence: Not on file   ROS-see HPI   + = positive Constitutional:    weight loss, night sweats, fevers, chills, fatigue, lassitude. HEENT:    +headaches, difficulty swallowing, tooth/dental problems, sore throat,       +sneezing, itching, +ear ache, +nasal congestion, post nasal drip, snoring CV:    chest pain, orthopnea, PND, swelling in lower extremities, anasarca,                                  dizziness,  +palpitations Resp:  + shortness of breath with exertion or at rest.                +productive cough,   non-productive cough, coughing up of blood.              change in color of mucus.  wheezing.   Skin:  rash or lesions. GI:  +heartburn, +indigestion, +abdominal pain, nausea, vomiting, diarrhea,                 change in bowel habits, loss of appetite GU: dysuria, change in color of urine, no urgency or frequency.   flank pain. MS:   +joint pain, stiffness, decreased range of motion, back pain. Neuro-     nothing unusual Psych:  change in mood or affect.  +depression or +anxiety.   memory loss.  OBJ- Physical Exam +overweight General- Alert, Oriented, Affect-appropriate, Distress- none acute Skin- rash-none, lesions- none, excoriation- none Lymphadenopathy- none Head- atraumatic            Eyes- Gross vision intact, PERRLA, conjunctivae and secretions clear            Ears- Hearing, canals-normal            Nose- Clear, no-Septal dev, mucus, polyps, erosion, perforation             Throat- Mallampati III , mucosa clear , drainage- none, tonsils- atrophic, +teeth Neck- flexible , trachea midline, no stridor , thyroid nl, carotid no bruit Chest - symmetrical excursion , unlabored           Heart/CV- RRR , no murmur , no gallop  , no rub, nl s1 s2                           - JVD- none , edema- none, stasis changes- none, varices- none           Lung- clear to P&A, wheeze- none, cough- none , dullness-none, rub- none           Chest wall-  Abd-  Br/ Gen/ Rectal- Not done, not indicated Extrem- cyanosis- none, clubbing, none, atrophy- none, strength- nl Neuro- grossly intact to observation

## 2023-09-16 ENCOUNTER — Encounter: Payer: Self-pay | Admitting: Internal Medicine

## 2023-09-16 ENCOUNTER — Ambulatory Visit: Payer: No Typology Code available for payment source

## 2023-09-16 ENCOUNTER — Ambulatory Visit (INDEPENDENT_AMBULATORY_CARE_PROVIDER_SITE_OTHER): Payer: No Typology Code available for payment source | Admitting: Internal Medicine

## 2023-09-16 VITALS — BP 104/68 | HR 75 | Ht 65.0 in | Wt 232.6 lb

## 2023-09-16 DIAGNOSIS — R058 Other specified cough: Secondary | ICD-10-CM

## 2023-09-16 DIAGNOSIS — K219 Gastro-esophageal reflux disease without esophagitis: Secondary | ICD-10-CM

## 2023-09-16 DIAGNOSIS — R0683 Snoring: Secondary | ICD-10-CM

## 2023-09-16 DIAGNOSIS — I517 Cardiomegaly: Secondary | ICD-10-CM | POA: Diagnosis not present

## 2023-09-16 DIAGNOSIS — R0602 Shortness of breath: Secondary | ICD-10-CM | POA: Diagnosis not present

## 2023-09-16 NOTE — Patient Instructions (Signed)
Order- schedule home sleep test   dx snoring  Please call me about 2 weeks after your sleep test for results and recommendations

## 2023-09-17 ENCOUNTER — Encounter: Payer: Self-pay | Admitting: Gastroenterology

## 2023-09-17 ENCOUNTER — Ambulatory Visit: Payer: No Typology Code available for payment source | Admitting: Gastroenterology

## 2023-09-17 VITALS — BP 122/80 | HR 64 | Ht 65.0 in | Wt 231.0 lb

## 2023-09-17 DIAGNOSIS — R12 Heartburn: Secondary | ICD-10-CM | POA: Diagnosis not present

## 2023-09-17 DIAGNOSIS — R053 Chronic cough: Secondary | ICD-10-CM | POA: Diagnosis not present

## 2023-09-17 DIAGNOSIS — R194 Change in bowel habit: Secondary | ICD-10-CM

## 2023-09-17 NOTE — Patient Instructions (Signed)
You have been scheduled for an endoscopy. Please follow written instructions given to you at your visit today.  If you use inhalers (even only as needed), please bring them with you on the day of your procedure.  If you take any of the following medications, they will need to be adjusted prior to your procedure:   DO NOT TAKE 7 DAYS PRIOR TO TEST- Trulicity (dulaglutide) Ozempic, Wegovy (semaglutide) Mounjaro (tirzepatide) Bydureon Bcise (exanatide extended release)  DO NOT TAKE 1 DAY PRIOR TO YOUR TEST Rybelsus (semaglutide) Adlyxin (lixisenatide) Victoza (liraglutide) Byetta (exanatide) ___________________________________________________________________________     _______________________________________________________  If your blood pressure at your visit was 140/90 or greater, please contact your primary care physician to follow up on this.  _______________________________________________________  If you are age 77 or older, your body mass index should be between 23-30. Your Body mass index is 38.44 kg/m. If this is out of the aforementioned range listed, please consider follow up with your Primary Care Provider.  If you are age 55 or younger, your body mass index should be between 19-25. Your Body mass index is 38.44 kg/m. If this is out of the aformentioned range listed, please consider follow up with your Primary Care Provider.   ________________________________________________________  The [facility] providers would like to encourage you to use [facility] to communicate with providers for non-urgent requests or questions.  Due to long hold times on the telephone, sending your provider a message by [facility] may be a faster and more efficient way to get a response.  Please allow 48 business hours for a response.  Please remember that this is for non-urgent requests.  _______________________________________________________ It was a pleasure to see you today!  Thank you for  trusting me with your gastrointestinal care!

## 2023-09-17 NOTE — Progress Notes (Signed)
[facility] Consult Note:  History: Yvette Hart 09/17/2023  Referring provider: Shirline Frees, NP  Reason for consult/chief complaint: Gastroesophageal Reflux (Pt states she has a lot of coughing spells. She states she coughs so hard she ends vomiting.) and Constipation (Pt states she has trouble moving her bowels. It sometimes turn into loose stools.)   Subjective  Prior history:  Samaris saw me here in July 2022 for severe flare of chronic heartburn and reflux symptoms after being treated for chronic cough with a azithromycin.  Heme-negative on exam, symptomatic treatment given.  H. pylori antibody serum IgG antibody negative.   Discussed the use of AI scribe software for clinical note transcription with the patient, who gave verbal consent to proceed.  History of Present Illness   The patient, with a history of reflux, presents with a chronic, nonproductive cough and a sensation of acid reflux that has been ongoing for approximately eight years. The symptoms, including coughing, belching, and a burning sensation in the throat, are cyclical, lasting for about four months before subsiding and then returning. The patient reports that the symptoms are not relieved by omeprazole and are so severe that they often lead to gagging and vomiting. The patient also reports feeling bloated after eating.  In addition, because of a cough when laying flat for sleep, she has had to sleep with the bed at an incline to alleviate symptoms.  In addition to these possible reflux symptoms, the patient has been experiencing increased bowel frequency for the past year, with multiple bowel movements in the morning and throughout the day. The stools are described as soft, and the patient reports a sense of urgency with bowel movements. The patient is currently on metformin, which is known to cause soft stools, but the increased frequency predates the start of this medication.  The patient also  has a family history of rectal cancer, with their father being diagnosed a few years ago (in his mid 73s). However, the patient does not report any blood in the stool or other alarming symptoms.  In the past, the patient recalls having had a CT scan that showed a small hiatal hernia, but it is unclear if this is contributing to the current symptoms. The patient is seeking a better understanding of the cause of these symptoms and is open to further diagnostic procedures.  This chronic cough is affecting every aspect of her life and she is hoping for some help with it and understands she may need to see multiple consultants.    Pele saw a pulmonary consultant (Dr. Maple Hudson) for the first time yesterday, some additional information noted below.  She was given an inhaler to use, difficult to say yet if it is helping.  ROS:  Review of Systems  Constitutional:  Negative for appetite change and unexpected weight change.  HENT:  Negative for mouth sores and voice change.   Eyes:  Negative for pain and redness.  Respiratory:  Positive for cough. Negative for shortness of breath.   Cardiovascular:  Negative for chest pain and palpitations.  Genitourinary:  Negative for dysuria and hematuria.  Musculoskeletal:  Negative for arthralgias and myalgias.  Skin:  Negative for pallor and rash.  Neurological:  Negative for weakness and headaches.  Hematological:  Negative for adenopathy.     Past Medical History: Past Medical History:  Diagnosis Date   Anxiety    Back pain    Breast discharge    Cardiomyopathy (HCC)    monitored by [facility]  medical [place] pt bringing records   Chest pain    Chicken pox    Constipation    Depression    Fatty liver    Food allergy    Gallbladder problem    GERD (gastroesophageal reflux disease)    Heart valve problem    Hepatic artery stenosis (HCC)    Hip pain    History of swelling of feet    Hyperlipidemia    IBS (irritable bowel syndrome)    Joint  pain    Lactose intolerance    Neck pain    Obesity    Osteoarthritis    Palpitations    PCOS (polycystic ovarian syndrome)    SOB (shortness of breath)    Stomach ulcer    SVD (spontaneous vaginal delivery) 01/26/2018   Swallowing difficulty    Urinary incontinence    Vitamin D deficiency    She saw her pulmonologist Dr. Maple Hudson yesterday for dyspnea cough and question of sleep apnea.  Sleep study scheduled, chest x-ray done (no report yet)  Past Surgical History: Past Surgical History:  Procedure Laterality Date   NO PAST SURGERIES       Family History: Family History  Problem Relation Age of Onset   Depression Mother    Miscarriages / [place] Mother        TWICE   Cancer Mother    Hypertension Father    Cancer Father    Mental illness Sister    Depression Sister    Asthma Brother    Depression Brother    Cervical cancer Paternal Aunt    Ovarian cancer Paternal Aunt    Stroke Maternal Grandmother    Heart disease Paternal Grandmother    Breast cancer Paternal Grandmother    Diabetes Paternal Grandmother    COPD Paternal Grandmother    Heart attack Paternal Grandmother    Hypertension Paternal Grandmother    Colon polyps Paternal Grandfather    Colon cancer Paternal Grandfather    Crohn's disease Cousin    Esophageal cancer Neg Hx    Pancreatic cancer Neg Hx    Stomach cancer Neg Hx   Cousin has Crohn's disease No known celiac  Social History: Social History   Socioeconomic History   Marital status: Married    Spouse name: Not on file   Number of children: Not on file   Years of education: Not on file   Highest education level: Associate degree: occupational, Scientist, product/process development, or vocational program  Occupational History   Not on file  Tobacco Use   Smoking status: Never   Smokeless tobacco: Never  Vaping Use   Vaping status: Never Used  Substance and Sexual Activity   Alcohol use: Yes    Alcohol/week: 3.0 standard drinks of alcohol    Types: 1  Glasses of wine, 1 Cans of beer, 1 Shots of liquor per week    Comment: social ONCE A MONTH   Drug use: No   Sexual activity: Yes    Birth control/protection: None  Other Topics Concern   Not on file  Social History Narrative   Not on file   Social Determinants of Health   Financial Resource Strain: Medium Risk (04/24/2023)   Overall Financial Resource Strain (CARDIA)    Difficulty of Paying Living Expenses: Somewhat hard  Food Insecurity: No Food Insecurity (04/24/2023)   Hunger Vital Sign    Worried About Running Out of Food in the Last Year: Never true    Ran Out of  Food in the Last Year: Never true  Transportation Needs: No Transportation Needs (04/24/2023)   PRAPARE - Administrator, Civil Service (Medical): No    Lack of Transportation (Non-Medical): No  Physical Activity: Insufficiently Active (04/24/2023)   Exercise Vital Sign    Days of Exercise per Week: 1 day    Minutes of Exercise per Session: 20 min  Stress: No Stress Concern Present (04/24/2023)   [employer] of Occupational Health - Occupational Stress Questionnaire    Feeling of Stress : Only a little  Social Connections: Moderately Integrated (04/24/2023)   Social Connection and Isolation Panel [NHANES]    Frequency of Communication with Friends and Family: Twice a week    Frequency of Social Gatherings with Friends and Family: Once a week    Attends Religious Services: More than 4 times per year    Active Member of [employer] or Organizations: No    Attends Engineer, structural: Not on file    Marital Status: Married    Allergies: Allergies  Allergen Reactions   Latex Rash   Onion Nausea And Vomiting and Other (See Comments)    Upset stomache    Outpatient Meds: Current Outpatient Medications  Medication Sig Dispense Refill   acetaminophen (TYLENOL) 500 MG tablet Take 500-1,000 mg by mouth every 6 (six) hours as needed for mild pain (pain score 1-3) or headache.     buPROPion  (WELLBUTRIN XL) 300 MG 24 hr tablet Take 1 tablet (300 mg total) by mouth daily. 90 tablet 1   cholecalciferol (VITAMIN D3) 25 MCG (1000 UNIT) tablet Take 1,000 Units by mouth daily.     docusate (COLACE) 50 MG/5ML liquid Take by mouth daily.     lactobacillus acidophilus (BACID) TABS tablet Take 2 tablets by mouth 3 (three) times daily.     levonorgestrel (MIRENA, 52 MG,) 20 MCG/DAY IUD Take 1 device by intrauterine route.     metFORMIN (GLUCOPHAGE) 500 MG tablet Take 1 tablet (500 mg total) by mouth 2 (two) times daily with a meal. 60 tablet 0   No current facility-administered medications for this visit.      ___________________________________________________________________ Objective   Exam:  BP 122/80   Pulse 64   Ht 5\' 5"  (1.651 m)   Wt 231 lb (104.8 kg)   BMI 38.44 kg/m  Wt Readings from Last 3 Encounters:  09/17/23 231 lb (104.8 kg)  09/16/23 232 lb 9.6 oz (105.5 kg)  08/28/23 236 lb 3.2 oz (107.1 kg)    General: Well-appearing, normal vocal quality.  Intermittent dry cough Eyes: sclera anicteric, no redness ENT: oral mucosa moist without lesions, no cervical or supraclavicular lymphadenopathy CV: Regular without appreciable murmur, no JVD, no peripheral edema Resp: clear to auscultation bilaterally, normal RR and effort noted GI: soft, no tenderness, with active bowel sounds. No guarding or palpable organomegaly noted. Skin; warm and dry, no rash or jaundice noted Neuro: awake, alert and oriented x 3. Normal gross motor function and fluent speech   Labs:  Chest x-ray from yesterday not yet reported   Encounter Diagnoses  Name Primary?   Chronic cough Yes   Heartburn    Change in bowel habits    While she has some intermittent heartburn and regurgitation, it is difficult to determine how much reflux she is experiencing and to what extent that may be contributing to her chronic cough.  We described the complex nature of cough and how may have multiple  contributing factors (reflux, allergies  and other sinus conditions with postnasal drip, asthma)  She also seems to be having some IBS-like symptoms, less likely IBD or celiac. Assessment and Plan    Gastroesophageal Reflux Disease (GERD) Chronic cough, belching, and burning sensation in throat, particularly after meals. Symptoms persist despite Omeprazole 20mg  daily. No dysphagia or regurgitation of undigested food. Possible history of small hiatal hernia. -Plan for upper endoscopy with biopsies to evaluate for esophagitis or eosinophilic esophagitis. -Consider increasing dose of acid suppression medication after endoscopy.  I offered to do that now, but she would like to wait until after the EGD, as it is possible that an increased dose of acid suppression might mask esophagitis that would otherwise be present.   The benefits and risks of the planned procedure were described in detail with the patient or (when appropriate) their health care proxy.  Risks were outlined as including, but not limited to, bleeding, infection, perforation, adverse medication reaction leading to cardiac or pulmonary decompensation, pancreatitis (if ERCP).  The limitation of incomplete mucosal visualization was also discussed.  No guarantees or warranties were given.   Irritable Bowel Syndrome (IBS) Increased bowel frequency for the past year, with urgency and soft stools. No blood in stool. History of metformin use, but symptoms predate medication. -Consider antispasmodic medication if symptoms become more bothersome.  It was offered but she politely declined at this time.  Family History of Rectal Cancer Father diagnosed with rectal cancer in his mid-60s. -Consider colonoscopy given changes in bowel habits and family history.  Her symptoms do not sound likely to be colorectal cancer.  Colonoscopy was offered, but she would like to revisit that after we attempt to her upper digestive issues and investigation of  cough.   Thank you for the courtesy of this consult.  Please call me with any questions or concerns.  Charlie Pitter III  CC: Referring provider noted above

## 2023-09-18 ENCOUNTER — Encounter: Payer: Self-pay | Admitting: Internal Medicine

## 2023-09-18 DIAGNOSIS — K219 Gastro-esophageal reflux disease without esophagitis: Secondary | ICD-10-CM | POA: Insufficient documentation

## 2023-09-18 NOTE — Assessment & Plan Note (Signed)
On omeprazole and head of bed elevated Pending appointment with GI.

## 2023-09-18 NOTE — Assessment & Plan Note (Signed)
Some DOE and intermittent productive cough x 8 years. She asks help with this, Plan- CXR, sample Trelegy 100 trial, anticipate PFT

## 2023-09-18 NOTE — Assessment & Plan Note (Signed)
Cardiology following

## 2023-09-23 ENCOUNTER — Telehealth: Payer: Self-pay | Admitting: Gastroenterology

## 2023-09-23 NOTE — Telephone Encounter (Signed)
Patient called and stated she actually wanted to go ahead and schedule her EGD and colonoscopy to be done at the same time per her last office visit consultation. 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 Patient has been scheduled for EGD and Colonoscopy for 11/19/2023 at 2:00 pm.

## 2023-09-24 ENCOUNTER — Other Ambulatory Visit: Payer: Self-pay

## 2023-09-24 DIAGNOSIS — R053 Chronic cough: Secondary | ICD-10-CM

## 2023-09-24 DIAGNOSIS — R12 Heartburn: Secondary | ICD-10-CM

## 2023-09-24 DIAGNOSIS — R194 Change in bowel habit: Secondary | ICD-10-CM

## 2023-09-24 MED ORDER — PLENVU 140 G PO SOLR: 1.0000 | ORAL | 0 refills | Status: DC

## 2023-09-24 NOTE — Telephone Encounter (Signed)
New amb ref has been placed & prep sent to pharmacy. Updated instructions sent to mychart & also placed at the front.

## 2023-09-25 ENCOUNTER — Telehealth: Payer: Self-pay | Admitting: Internal Medicine

## 2023-09-25 DIAGNOSIS — R0683 Snoring: Secondary | ICD-10-CM

## 2023-09-25 MED ORDER — TRELEGY ELLIPTA 100-62.5-25 MCG/ACT IN AEPB: 1.0000 | INHALATION_SPRAY | Freq: Every day | RESPIRATORY_TRACT | 3 refills | Status: AC

## 2023-09-25 NOTE — Telephone Encounter (Signed)
Refill sent.

## 2023-09-25 NOTE — Telephone Encounter (Signed)
PT would like a RX called in for the sample Dr. Jeannie Fend gave her. States she likes it and has helped 90%. Trelegy.   Pharm is Therapist, occupational at [facility]. In [facility]

## 2023-10-09 ENCOUNTER — Ambulatory Visit (HOSPITAL_COMMUNITY): Payer: No Typology Code available for payment source | Attending: Cardiology

## 2023-10-09 DIAGNOSIS — I517 Cardiomegaly: Secondary | ICD-10-CM | POA: Insufficient documentation

## 2023-10-09 DIAGNOSIS — R0602 Shortness of breath: Secondary | ICD-10-CM | POA: Diagnosis present

## 2023-10-09 LAB — ECHOCARDIOGRAM COMPLETE
Area-P 1/2: 4.99 cm2
S' Lateral: 2.9 cm

## 2023-11-04 ENCOUNTER — Encounter: Payer: No Typology Code available for payment source | Admitting: Gastroenterology

## 2023-11-08 ENCOUNTER — Ambulatory Visit
Admit: 2023-11-08 | Discharge: 2023-11-15 | Payer: MEDICAID | Attending: Nurse Practitioner | Primary: Student in an Organized Health Care Education/Training Program

## 2023-11-08 ENCOUNTER — Ambulatory Visit: Payer: No Typology Code available for payment source | Attending: Cardiology | Admitting: Cardiology

## 2023-11-08 ENCOUNTER — Encounter: Payer: Self-pay | Admitting: Internal Medicine

## 2023-11-08 ENCOUNTER — Encounter: Payer: Self-pay | Admitting: Cardiology

## 2023-11-08 VITALS — BP 124/82 | HR 76 | Ht 65.0 in | Wt 238.6 lb

## 2023-11-08 VITALS — BP 116/77 | Ht 63.0 in | Wt 221.2 lb

## 2023-11-08 DIAGNOSIS — N898 Other specified noninflammatory disorders of vagina: Secondary | ICD-10-CM

## 2023-11-08 DIAGNOSIS — R0789 Other chest pain: Secondary | ICD-10-CM | POA: Diagnosis not present

## 2023-11-08 DIAGNOSIS — I517 Cardiomegaly: Secondary | ICD-10-CM

## 2023-11-08 NOTE — Patient Instructions (Signed)
 Medication Instructions:  NO changes    Follow-Up: At [facility], you and your health needs are our priority.  As part of our continuing mission to provide you with exceptional heart care, we have created designated Provider Care Teams.  These Care Teams include your primary Cardiologist (physician) and Advanced Practice Providers (APPs -  Physician Assistants and Nurse Practitioners) who all work together to provide you with the care you need, when you need it.    Your next appointment:   As needed next 1-3 years  Provider:   Alm Clay, MD     Other Instructions

## 2023-11-08 NOTE — Progress Notes (Signed)
 Cardiology Office Note:  .   Date:  11/14/2023  ID:  Yvette Hart, DOB 25-Mar-1991, MRN [id number] PCP: Merna Huxley, NP   HeartCare Providers Cardiologist:  Alm Clay, MD     Chief Complaint  Patient presents with   Follow-up    Discussed test results    Patient Profile: .     Yvette Hart is a borderline morbidly obese 33 y.o. female with a PMH notable for DM-2 and reported hypertrophic cardiomyopathy who presents here for 9-month follow-up at the request of Merna Huxley, NP.  Yvette Hart is an obese 33 y.o. female non-smoker previously followed at [facility] by Ozell Kubas, PA) with a PMH notable for Hyperlipidemia, IBS and GERD as well as GAD who presents here for Evaluation of Shortness of Breath at the request of Merna Huxley, NP.   Yvette Hart was seen for initial consultation in October 2024 to establish new cardiologist.  She carried a diagnosis potential hypertrophic cardiomyopathy.  Her only cardiac symptoms were some palpitations and tachycardia symptoms, but no real exertional dyspnea. 2D Echo ordered to evaluate for LVH  Subjective  Discussed the use of AI scribe software for clinical note transcription with the patient, who gave verbal consent to proceed.  History of Present Illness   The patient, with a history of prediabetes, presented for a follow-up consultation after an echocardiogram. They reported no symptoms of chest tightness, pressure, pain, or shortness of breath. However, they did mention experiencing occasional heart fluttering, described as a brief, split-second sensation, which was not associated with any distress. This was sometimes followed by coughing, which they believed to be a reflex response. The patient also reported a brief, localized pain in the upper chest area, which they attributed to gas or possible arthritis at the junction of the ribs and sternum.  The patient expressed interest in potentially having another  child and had discussed this with their gynecologist. They were aware of the need to manage their prediabetes effectively during any future pregnancy to ensure both their own and the baby's health. The patient was on metformin , which they understood would need to be discontinued during pregnancy due to potential effects on the baby's kidneys.  The patient was satisfied with their current health status and expressed a desire to maintain regular follow-ups to monitor their heart health, planning to schedule another consultation within the next two to three years.     Cardiovascular ROS: no chest pain or dyspnea on exertion positive for - irregular heartbeat, palpitations, rapid heart rate, and these are all relatively controlled as noted negative for - edema, orthopnea, paroxysmal nocturnal dyspnea, shortness of breath, or lightheadedness, dizziness or wooziness, syncope/near syncope or TIA/amaurosis fugax, claudication  ROS:  Review of Systems - Negative except symptoms noted in HPI.    Objective   Current Meds  Medication Sig   acetaminophen  (TYLENOL ) 500 MG tablet Take 500-1,000 mg by mouth every 6 (six) hours as needed for mild pain (pain score 1-3) or headache.   buPROPion  (WELLBUTRIN  XL) 300 MG 24 hr tablet Take 1 tablet (300 mg total) by mouth daily.   cholecalciferol (VITAMIN D3) 25 MCG (1000 UNIT) tablet Take 1,000 Units by mouth daily.   Fluticasone-Umeclidin-Vilant (TRELEGY ELLIPTA ) 100-62.5-25 MCG/ACT AEPB Inhale 1 puff into the lungs daily. Rinse mouth after   lactobacillus acidophilus (BACID) TABS tablet Take 2 tablets by mouth 3 (three) times daily.   levonorgestrel (MIRENA, 52 MG,) 20 MCG/DAY IUD Take 1 device by intrauterine route.  metFORMIN  (GLUCOPHAGE ) 500 MG tablet Take 1 tablet (500 mg total) by mouth 2 (two) times daily with a meal.   Studies Reviewed: .        Echocardiogram: Ejection fraction 55-60%, no wall motion abnormalities, normal strain, normal right  ventricle function, normal mitral valve, posterior wall thickness 1 cm, septal wall thickness 1-1.2 cm, normal aortic valve function, normal left atrium size, no LVH, mild sigmoid septum (07/2023)  Risk Assessment/Calculations:         Physical Exam:   VS:  BP 124/82 (BP Location: Left Arm, Patient Position: Sitting, Cuff Size: Large)   Pulse 76   Ht 5' 5 (1.651 m)   Wt 238 lb 9.6 oz (108.2 kg)   SpO2 98%   BMI 39.71 kg/m    Wt Readings from Last 3 Encounters:  11/08/23 238 lb 9.6 oz (108.2 kg)  09/17/23 231 lb (104.8 kg)  09/16/23 232 lb 9.6 oz (105.5 kg)    GEN: Well nourished, well groomed in no acute distress; otherwise healthy-appearing NECK: No JVD; No carotid bruits CARDIAC: Normal S1, S2; RRR, no murmurs, rubs, gallops RESPIRATORY:  Clear to auscultation without rales, wheezing or rhonchi ; nonlabored, good air movement. ABDOMEN: Soft, non-tender, non-distended EXTREMITIES:  No edema; No deformity     ASSESSMENT AND PLAN: .    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 Normal ejection fraction of 55-60%. Occasional heart flutter noted, likely related to stress or caffeine intake. No chest pain, shortness of breath, or other cardiac symptoms reported. -No changes to current management. -Consider follow-up in 2-3 years to maintain active patient status.        Other   Atypical chest pain   Brief, localized chest pain likely related to costochondritis or gas, not cardiac in origin. -No specific intervention needed at this time.       Potential Future Pregnancy Discussed potential impact of metformin  on pregnancy. No current cardiac contraindications for pregnancy. -Recommend discontinuation of metformin  if planning for pregnancy. -Ensure good glycemic control prior to and  during pregnancy to prevent gestational diabetes.  Follow-Up: Return for Followup when necessary.  Patient indicates that she would like to come back in 1 to 3 years to maintain active relationship with cardiology.    Signed, Alm MICAEL Clay, MD, MS Alm Clay, M.D., M.S. Interventional Cardiologist  [facility]  Pager # [phone] Phone # [phone] [address] [place], [place] [address]

## 2023-11-08 NOTE — Other (Signed)
Her swab showed BV and I have sent a rx to her pharmacy for metrogel. Please let her know

## 2023-11-08 NOTE — Progress Notes (Signed)
Chaperone for Intimate Exam  Chaperone was offered as part of the rooming process. Patient declined and agrees to continue with exam without a chaperone.  Chaperone: NONE

## 2023-11-08 NOTE — Progress Notes (Signed)
[facility] [place], [facility]  [facility] [address]y  [address]  [place] [address]  Dept: [phone]  Dept Fax: [phone]         11/08/2023  Kara Freeman       Chief Complaint  Chief Complaint   Patient presents with    Establish Care     Last pap 04/30/18 ASCUS HPV- (HYST)     Vaginal Discharge    Vaginal Odor    .    Blood pressure 116/77, height 1.6 m (5\' 3" ), weight 100.3 kg (221 lb 3.2 oz), last menstrual period 11/22/2017, not currently breastfeeding.    HPI:     Kara Freeman  1991/08/18 is a 33 y.o. G3P3003 here today for evaluation of vaginal discharge. She says she is prone to recurrent BV and denies need for STI testing  She has had a RAVH-BS for abnormal bleeding      OB History   Gravida Para Term Preterm AB Living   3 3 3  0 0 3   SAB IAB Ectopic Molar Multiple Live Births   0 0 0 0 0 3      # Outcome Date GA Lbr Len/2nd Weight Sex Type Anes PTL Lv   3 Term 08/29/18 [redacted]w[redacted]d  3.62 kg (7 lb 15.7 oz) M CS-LTranv Spinal N LIV      Apgar1: 9  Apgar5: 9   2 Term 01/05/15 [redacted]w[redacted]d  2.892 kg (6 lb 6 oz) F CS-LTranv Spinal N LIV      Birth Comments: labor,msaf, rpt, "Kara Freeman"      Name: Kara Freeman      Apgar1: 8  Apgar5: 9   1 Term 01/24/09 [redacted]w[redacted]d  2.835 kg (6 lb 4 oz) F CS-LTranv EPI  LIV      Birth Comments: C-section due to condyloma break out       Name: Kara Freeman       Past Medical History:   Diagnosis Date    Herpes        Past Surgical History:   Procedure Laterality Date    ABDOMINOPLASTY  2022    BREAST REDUCTION SURGERY Bilateral 08/19/2023    CESAREAN SECTION  01/24/2009    CESAREAN SECTION  08/29/2018    CESAREAN SECTION  01/05/2015    COSMETIC SURGERY  2023    BBO    ROBOTIC ASSISTED HYSTERECTOMY      SALPINGO-OOPHORECTOMY Bilateral     TUBAL LIGATION         Family History   Problem Relation Age of Onset    Liver Cancer Father     No Known Problems Maternal Grandmother     No Known Problems Maternal Grandfather     No Known Problems Paternal Grandmother      No Known Problems Paternal Grandfather     Cervical Cancer Paternal Aunt        Social History     Socioeconomic History    Marital status: Single     Spouse name: Not on file    Number of children: Not on file    Years of education: Not on file    Highest education level: Not on file   Occupational History    Not on file   Tobacco Use    Smoking status: Never    Smokeless tobacco: Never   Vaping Use    Vaping status: Never Used   Substance and Sexual Activity    Alcohol use: No  Drug use: Yes     Types: Marijuana Sheran Fava)    Sexual activity: Yes     Birth control/protection: Surgical     Comment: tubal/hyst   Other Topics Concern    Not on file   Social History Narrative    Not on file     Social Determinants of Health     Financial Resource Strain: Not on file   Food Insecurity: No Food Insecurity (08/19/2023)    Received from [facility]    Hunger Screening     Within the past 12 months we worried whether our food would run out before we got money to buy more.: Never True     Within the past 12 months the food we bought just didn't last and we didn't have money to get more.: Never True   Transportation Needs: No Transportation Needs (08/19/2023)    Received from [facility]    PRAPARE - Transportation     Lack of Transportation (Medical): No     Lack of Transportation (Non-Medical): No   Physical Activity: Not on file   Stress: Not on file   Social Connections: Not on file   Intimate Partner Violence: Not on file   Housing Stability: Low Risk  (08/19/2023)    Received from [facility]    Housing Instability     Are you worried or concerned that in the next two months you may not have stable housing that you own, rent or stay in as a part of a household?: No       No data recorded     **If either question is answered in a  positive fashion then complete the PHQ9 Scoring Evaluation and make the appropriate referral**    MEDICATIONS:  No current outpatient medications on file.      No current facility-administered medications for this visit.         ALLERGIES:  Allergies as of 11/08/2023 - Fully Reviewed 11/08/2023   Allergen Reaction Noted    Bactrim [sulfamethoxazole-trimethoprim] Nausea And Vomiting 01/13/2015         REVIEW OF SYSTEMS:        Review of Systems   Constitutional: Negative.    HENT: Negative.     Respiratory: Negative.     Cardiovascular: Negative.    Gastrointestinal: Negative.    Genitourinary:  Positive for dysuria and vaginal discharge.   Musculoskeletal: Negative.    Skin: Negative.               Physical Exam  Constitutional:       Appearance: Normal appearance. She is obese.   HENT:      Head: Normocephalic and atraumatic.   Pulmonary:      Effort: Pulmonary effort is normal.   Genitourinary:     General: Normal vulva.      Vagina: Vaginal discharge present.      Uterus: Absent.    Musculoskeletal:         General: Normal range of motion.      Cervical back: Neck supple.   Skin:     General: Skin is warm and dry.   Neurological:      Mental Status: She is alert.   Psychiatric:         Mood and Affect: Mood normal.                Assessment/Plan   Diagnosis Orders   1. Vaginal discharge  Vaginitis DNA Probe  2. Vaginal odor  Vaginitis DNA Probe        1. Vaginal discharge  -     Vaginitis DNA Probe; Future  2. Vaginal odor  -     Vaginitis DNA Probe; Future       We discussed hygiene, boric acid and vitamin d supplementation  I document recurrent BV we discussed weekly metrogel for suppression        Return in about 1 year (around 11/07/2024) for annual exam.        Electronically signed by Vernie Shanks, APRN - CNP 11/08/2023 2:24 PM

## 2023-11-09 ENCOUNTER — Inpatient Hospital Stay: Payer: MEDICAID | Primary: Student in an Organized Health Care Education/Training Program

## 2023-11-09 LAB — VAGINITIS DNA PROBE
Candida species: NEGATIVE
GARDNERELLA VAGINALIS: POSITIVE — AB
Trichomonas: NEGATIVE

## 2023-11-10 NOTE — Telephone Encounter (Signed)
 Home sleep test showed mild obstructive sleep apnea, averaging 8-9 apneas/ hour. Based on your complaint of persistent tiredness, it would be worth trying treatment to see if we can make a difference. I suggest either CPAP or referral to get you a fitted oral appliance. We discussed these at our first visit. If you know what you want to do, we can go ahead now. Otherwise, we can wait and discuss in more detail at your February appointment.

## 2023-11-11 MED ORDER — METRONIDAZOLE 0.75 % VA GEL
0.75 | Freq: Every evening | VAGINAL | 0 refills | Status: AC
Start: 2023-11-11 — End: 2023-11-16

## 2023-11-11 NOTE — Addendum Note (Signed)
Addended by: Vernie Shanks on: 11/11/2023 07:46 AM     Modules accepted: Orders

## 2023-11-12 ENCOUNTER — Encounter: Payer: Self-pay | Admitting: Gastroenterology

## 2023-11-14 ENCOUNTER — Encounter: Payer: Self-pay | Admitting: Cardiology

## 2023-11-14 DIAGNOSIS — R0789 Other chest pain: Secondary | ICD-10-CM | POA: Insufficient documentation

## 2023-11-14 NOTE — Assessment & Plan Note (Signed)
Normal echocardiogram with no signs of left ventricular hypertrophy or valve abnormalities. Normal ejection fraction of 55-60%. Occasional heart flutter noted, likely related to stress or caffeine intake. No chest pain, shortness of breath, or other cardiac symptoms reported. -No changes to current management. -Consider follow-up in 2-3 years to maintain active patient status.

## 2023-11-14 NOTE — Assessment & Plan Note (Signed)
Brief, localized chest pain likely related to costochondritis or gas, not cardiac in origin. -No specific intervention needed at this time.

## 2023-11-19 ENCOUNTER — Ambulatory Visit
Admit: 2023-11-19 | Discharge: 2023-11-26 | Payer: MEDICAID | Primary: Student in an Organized Health Care Education/Training Program

## 2023-11-19 ENCOUNTER — Inpatient Hospital Stay: Payer: MEDICAID | Primary: Student in an Organized Health Care Education/Training Program

## 2023-11-19 ENCOUNTER — Encounter: Payer: Self-pay | Admitting: Gastroenterology

## 2023-11-19 ENCOUNTER — Ambulatory Visit: Payer: No Typology Code available for payment source | Admitting: Gastroenterology

## 2023-11-19 VITALS — BP 124/68 | HR 66 | Temp 98.0°F | Resp 15 | Ht 65.0 in | Wt 231.0 lb

## 2023-11-19 VITALS — BP 130/83 | HR 84 | Wt 221.0 lb

## 2023-11-19 DIAGNOSIS — N898 Other specified noninflammatory disorders of vagina: Secondary | ICD-10-CM

## 2023-11-19 DIAGNOSIS — R053 Chronic cough: Secondary | ICD-10-CM

## 2023-11-19 DIAGNOSIS — K529 Noninfective gastroenteritis and colitis, unspecified: Secondary | ICD-10-CM

## 2023-11-19 DIAGNOSIS — R142 Eructation: Secondary | ICD-10-CM | POA: Diagnosis not present

## 2023-11-19 LAB — VAGINITIS DNA PROBE
Candida species: NEGATIVE
GARDNERELLA VAGINALIS: POSITIVE — AB
Trichomonas: POSITIVE — AB

## 2023-11-19 MED ORDER — SODIUM CHLORIDE 0.9 % IV SOLN: 500.0000 mL | Freq: Once | INTRAVENOUS | Status: DC

## 2023-11-19 MED ORDER — METRONIDAZOLE 0.75 % VA GEL
0.75 | Freq: Every evening | VAGINAL | 0 refills | Status: AC
Start: 2023-11-19 — End: 2023-11-26

## 2023-11-19 MED ORDER — HYDROCORTISONE 2.5 % EX CREA
2.5 | CUTANEOUS | 0 refills | 10.00000 days | Status: DC
Start: 2023-11-19 — End: 2024-10-26

## 2023-11-19 MED ORDER — FLUCONAZOLE 150 MG PO TABS
150 | ORAL_TABLET | Freq: Once | ORAL | 0 refills | Status: AC
Start: 2023-11-19 — End: 2023-11-19

## 2023-11-19 NOTE — Patient Instructions (Addendum)
- Proceed with esophageal pH , impedance and                            manometry testing.                           - Resume previous diet.                           - Continue present medications.                           - Await pathology results.                           - Repeat colonoscopy in 5 years for screening                            purposes (father Dx with rectal cancer in his 30s).                            YOU HAD AN ENDOSCOPIC PROCEDURE TODAY AT THE [facility]:   Refer to the procedure report that was given to you for any specific questions about what was found during the examination.  If the procedure report does not answer your questions, please call your gastroenterologist to clarify.  If you requested that your care partner not be given the details of your procedure findings, then the procedure report has been included in a sealed envelope for you to review at your convenience later.  YOU SHOULD EXPECT: Some feelings of bloating in the abdomen. Passage of more gas than usual.  Walking can help get rid of the air that was put into your GI tract during the procedure and reduce the bloating. If you had a lower endoscopy (such as a colonoscopy or flexible sigmoidoscopy) you may notice spotting of blood in your stool or on the toilet paper. If you underwent a bowel prep for your procedure, you may not have a normal bowel movement for a few days.  Please Note:  You might notice some irritation and congestion in your nose or some drainage.  This is from the oxygen used during your procedure.  There is no need for concern and it should clear up in a day or so.  SYMPTOMS TO REPORT IMMEDIATELY:  Following lower endoscopy (colonoscopy or flexible sigmoidoscopy):  Excessive amounts of blood in the stool  Significant tenderness or worsening of abdominal pains  Swelling of the abdomen that is new, acute  Fever of 100F or higher  Following  upper endoscopy (EGD)  Vomiting of blood or coffee ground material  New chest pain or pain under the shoulder blades  Painful or persistently difficult swallowing  New shortness of breath  Fever of 100F or higher  Black, tarry-looking stools  For urgent or emergent issues, a gastroenterologist can be reached at any hour by calling [phone]. Do not use MyChart messaging for urgent concerns.    DIET:  We do recommend a small meal at first, but then you may proceed to your regular diet.  Drink plenty of fluids but you should avoid alcoholic beverages for 24 hours.  ACTIVITY:  You should plan to  take it easy for the rest of today and you should NOT DRIVE or use heavy machinery until tomorrow (because of the sedation medicines used during the test).    FOLLOW UP: Our staff will call the number listed on your records the next business day following your procedure.  We will call around 7:15- 8:00 am to check on you and address any questions or concerns that you may have regarding the information given to you following your procedure. If we do not reach you, we will leave a message.     If any biopsies were taken you will be contacted by phone or by letter within the next 1-3 weeks.  Please call us at [phone] if you have not heard about the biopsies in 3 weeks.    SIGNATURES/CONFIDENTIALITY: You and/or your care partner have signed paperwork which will be entered into your electronic medical record.  These signatures attest to the fact that that the information above on your After Visit Summary has been reviewed and is understood.  Full responsibility of the confidentiality of this discharge information lies with you and/or your care-partner.

## 2023-11-19 NOTE — Op Note (Signed)
[facility] Patient Name: Yvette Hart Procedure Date: 11/19/2023 1:56 PM MRN: [id number] Endoscopist: Sherilyn Cooter L. Myrtie Neither , MD, [id number] Age: 33 Referring MD:  Date of Birth: 01-28-1991 Gender: Female Account #: [id number] Procedure:                Upper GI endoscopy Indications:              Symptoms suggestive of Esophageal reflux that                            persist despite appropriate therapy, Chronic cough,                            chronic (intermittent) diarrhea Medicines:                Monitored Anesthesia Care Procedure:                Pre-Anesthesia Assessment:                           - Prior to the procedure, a History and Physical                            was performed, and patient medications and                            allergies were reviewed. The patient's tolerance of                            previous anesthesia was also reviewed. The risks                            and benefits of the procedure and the sedation                            options and risks were discussed with the patient.                            All questions were answered, and informed consent                            was obtained. Prior Anticoagulants: The patient has                            taken no anticoagulant or antiplatelet agents. ASA                            Grade Assessment: II - A patient with mild systemic                            disease. After reviewing the risks and benefits,                            the patient was deemed in satisfactory condition to  undergo the procedure.                           After obtaining informed consent, the endoscope was                            passed under direct vision. Throughout the                            procedure, the patient's blood pressure, pulse, and                            oxygen saturations were monitored continuously. The                            Olympus Scope [id number]  was introduced through the                            mouth, and advanced to the second part of duodenum.                            The upper GI endoscopy was accomplished without                            difficulty. The patient tolerated the procedure                            (albeit with coughing toward end of procedure). Scope In: Scope Out: Findings:                 The esophagus was normal when initially examined.                            After patient began coughing, a small (1-2cm axial)                            hiatal hernia was seen with a somewhat more                            patulous EGJ.                           The stomach was normal.                           The cardia and gastric fundus were normal on                            retroflexion. (Hill grade 4)                           Normal mucosa was found in the entire duodenum.                            Biopsies for histology were taken with a cold  forceps for evaluation of celiac disease. Complications:            No immediate complications. Estimated Blood Loss:     Estimated blood loss was minimal. Impression:               - Normal esophagus other than small hiatal hernia                            seen when coughing.                           - Normal stomach.                           - Normal mucosa was found in the entire examined                            duodenum. Biopsied.                           Symptoms somewhat atypical for GERD. The extent to                            which any demonstrable GERD may be contributing to                            chronic cough is uncertain at present. Recommendation:           - Patient has a contact number available for                            emergencies. The signs and symptoms of potential                            delayed complications were discussed with the                            patient. Return to normal activities  tomorrow.                            Written discharge instructions were provided to the                            patient.                           - Resume previous diet.                           - Continue present medications.                           - See the other procedure note for documentation of                            additional recommendations.                           -  Await pathology results.                           - Proceed with esophageal pH , impedance and                            manometry testing. Sergio Zawislak L. Myrtie Neither, MD 11/19/2023 2:43:55 PM This report has been signed electronically.

## 2023-11-19 NOTE — Progress Notes (Signed)
History and Physical:  This patient presents for endoscopic testing for: Encounter Diagnoses  Name Primary?   Chronic cough Yes   Chronic diarrhea    Eructation     Clinical details in 09/17/23 office consult - she reports no significant changes since then. Chronic symptoms suggesting GERD, chronic cough (relation to other symptoms unclear), bloating, altered bowel habits and fam Hx rectal cancer (Father)  Patient is otherwise without complaints or active issues today.   Past Medical History: Past Medical History:  Diagnosis Date   Anxiety    Back pain    Breast discharge    Cardiomyopathy (HCC)    monitored by [facility] medical [place] pt bringing records   Chest pain    Chicken pox    Constipation    Depression    Diabetes mellitus without complication (HCC)    Fatty liver    Food allergy    Gallbladder problem    GERD (gastroesophageal reflux disease)    Heart valve problem    Hepatic artery stenosis (HCC)    Hip pain    History of swelling of feet    Hyperlipidemia    IBS (irritable bowel syndrome)    Joint pain    Lactose intolerance    Neck pain    Obesity    Osteoarthritis    Palpitations    PCOS (polycystic ovarian syndrome)    SOB (shortness of breath)    Stomach ulcer    SVD (spontaneous vaginal delivery) 01/26/2018   Swallowing difficulty    Urinary incontinence    Vitamin D deficiency      Past Surgical History: Past Surgical History:  Procedure Laterality Date   NO PAST SURGERIES      Allergies: Allergies  Allergen Reactions   Latex Rash   Onion Nausea And Vomiting and Other (See Comments)    Upset stomache    Outpatient Meds: Current Outpatient Medications  Medication Sig Dispense Refill   buPROPion (WELLBUTRIN XL) 300 MG 24 hr tablet Take 1 tablet (300 mg total) by mouth daily. 90 tablet 1   levonorgestrel (MIRENA, 52 MG,) 20 MCG/DAY IUD Take 1 device by intrauterine route.     metFORMIN (GLUCOPHAGE) 500 MG tablet Take 1  tablet (500 mg total) by mouth 2 (two) times daily with a meal. 60 tablet 0   acetaminophen (TYLENOL) 500 MG tablet Take 500-1,000 mg by mouth every 6 (six) hours as needed for mild pain (pain score 1-3) or headache.     cholecalciferol (VITAMIN D3) 25 MCG (1000 UNIT) tablet Take 1,000 Units by mouth daily.     Fluticasone-Umeclidin-Vilant (TRELEGY ELLIPTA) 100-62.5-25 MCG/ACT AEPB Inhale 1 puff into the lungs daily. Rinse mouth after 60 each 3   lactobacillus acidophilus (BACID) TABS tablet Take 2 tablets by mouth 3 (three) times daily.     Current Facility-Administered Medications  Medication Dose Route Frequency Provider Last Rate Last Admin   0.9 %  sodium chloride infusion  500 mL Intravenous Once Charlie Pitter III, MD          ___________________________________________________________________ Objective   Exam:  BP 127/70   Pulse 87   Temp 98 F (36.7 C) (Temporal)   Resp 17   Ht 5\' 5"  (1.651 m)   Wt 231 lb (104.8 kg)   SpO2 93%   BMI 38.44 kg/m   CV: regular , S1/S2 Resp: clear to auscultation bilaterally, normal RR and effort noted GI: soft, no tenderness, with active bowel sounds.  Assessment: Encounter Diagnoses  Name Primary?   Chronic cough Yes   Chronic diarrhea    Eructation      Plan: Colonoscopy EGD  The benefits and risks of the planned procedure were described in detail with the patient or (when appropriate) their health care proxy.  Risks were outlined as including, but not limited to, bleeding, infection, perforation, adverse medication reaction leading to cardiac or pulmonary decompensation, pancreatitis (if ERCP).  The limitation of incomplete mucosal visualization was also discussed.  No guarantees or warranties were given.  The patient is appropriate for an endoscopic procedure in the ambulatory setting.   - Amada Jupiter, MD

## 2023-11-19 NOTE — Progress Notes (Signed)
Called to room to assist during endoscopic procedure.  Patient ID and intended procedure confirmed with present staff. Received instructions for my participation in the procedure from the performing physician.  

## 2023-11-19 NOTE — Progress Notes (Signed)
Report to PACU, RN, vss, BBS= Clear.  

## 2023-11-19 NOTE — Op Note (Signed)
[facility] Patient Name: Yvette Hart Procedure Date: 11/19/2023 1:57 PM MRN: [id number] Endoscopist: Sherilyn Cooter L. Myrtie Neither , MD, [id number] Age: 33 Referring MD:  Date of Birth: 16-Jan-1991 Gender: Female Account #: [id number] Procedure:                Colonoscopy Indications:              Chronic diarrhea                           clinical details in Nov 2024 office consult note Medicines:                Monitored Anesthesia Care Procedure:                Pre-Anesthesia Assessment:                           - Prior to the procedure, a History and Physical                            was performed, and patient medications and                            allergies were reviewed. The patient's tolerance of                            previous anesthesia was also reviewed. The risks                            and benefits of the procedure and the sedation                            options and risks were discussed with the patient.                            All questions were answered, and informed consent                            was obtained. Prior Anticoagulants: The patient has                            taken no anticoagulant or antiplatelet agents. ASA                            Grade Assessment: II - A patient with mild systemic                            disease. After reviewing the risks and benefits,                            the patient was deemed in satisfactory condition to                            undergo the procedure.  After obtaining informed consent, the colonoscope                            was passed under direct vision. Throughout the                            procedure, the patient's blood pressure, pulse, and                            oxygen saturations were monitored continuously. The                            Olympus Scope [id number] was introduced through the                            anus and advanced to the the terminal  ileum, with                            identification of the appendiceal orifice and IC                            valve. The colonoscopy was performed without                            difficulty. The patient tolerated the procedure                            well. The quality of the bowel preparation was                            excellent. The terminal ileum, ileocecal valve,                            appendiceal orifice, and rectum were photographed. Scope In: 2:04:39 PM Scope Out: 2:15:59 PM Scope Withdrawal Time: 0 hours 8 minutes 53 seconds  Total Procedure Duration: 0 hours 11 minutes 20 seconds  Findings:                 The perianal and digital rectal examinations were                            normal.                           The terminal ileum appeared normal.                           Repeat examination of right colon under NBI                            performed.                           Normal mucosa was found in the entire colon.  Biopsies for histology were taken with a cold                            forceps from the right colon and left colon for                            evaluation of microscopic colitis.                           The exam was otherwise without abnormality on                            direct and retroflexion views. Complications:            No immediate complications. Estimated Blood Loss:     Estimated blood loss was minimal. Impression:               - The examined portion of the ileum was normal.                           - Normal mucosa in the entire examined colon.                            Biopsied.                           - The examination was otherwise normal on direct                            and retroflexion views.                           If Bx r/o microscopic colitis and duodenal Bx r/o                            celiac, the overall clinical picture favors IBS and                            a  trial of antispasmodic therapy could be given if                            patient interested. Recommendation:           - Patient has a contact number available for                            emergencies. The signs and symptoms of potential                            delayed complications were discussed with the                            patient. Return to normal activities tomorrow.                            Written discharge  instructions were provided to the                            patient.                           - Resume previous diet.                           - Continue present medications.                           - Await pathology results.                           - Repeat colonoscopy in 5 years for screening                            purposes (father Dx with rectal cancer in his 37s).                           - See the other procedure note for documentation of                            additional recommendations. Purvi Ruehl L. Myrtie Neither, MD 11/19/2023 2:35:13 PM This report has been signed electronically.

## 2023-11-19 NOTE — Progress Notes (Signed)
[facility] [place], [facility]  [facility] [facility]  [address]  [place] [address]  Dept: [phone]           Gynecologic problem visit    Patient: Kara Freeman  Primary Care Physician: Maree Erie, MD   Chief Complaint   Patient presents with    Vaginal Discharge     Finished Metrogel still having discharge and swollen in labia area      HPI: Kara Freeman is a 33 y.o. [id number] who presents today as a returning patient for evaluation of ongoing vaginal symptoms. She was  seen earlier this month as a new patient 1/10 tested positive for vaginitis - metrogel sent. She completed the course of metrogel but feels symptoms have worsened.  She has had a RAVH-BS and no vaginal bleeding. Active with a Long term partner. No pain with intercourse but avoiding sexual activity due to vaginal irritation. Reports vaginal itching, Odor and Vaginal discharge which is Yellow.     Had hyst 3.5 years ago. Bv seems more frequent after that. Using Kennedy unscented daily. She is using Douche bottles in the vaginal full of plain water.      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    OBSTETRICAL & GYNECOLOGICAL HISTORY:  OB History   Gravida Para Term Preterm AB Living   3 3 3  0 0 3   SAB IAB Ectopic Molar Multiple Live Births   0 0 0 0 0 3      # Outcome Date GA Lbr Len/2nd Weight Sex Type Anes PTL Lv   3 Term 08/29/18 [redacted]w[redacted]d  3.62 kg (7 lb 15.7 oz) M CS-LTranv Spinal N LIV      Apgar1: 9  Apgar5: 9   2 Term 01/05/15 [redacted]w[redacted]d  2.892 kg (6 lb 6 oz) F CS-LTranv Spinal N LIV      Birth Comments: labor,msaf, rpt, "Kara Freeman"      Name: Kara Freeman      Apgar1: 8  Apgar5: 9   1 Term 01/24/09 [redacted]w[redacted]d  2.835 kg (6 lb 4 oz) F CS-LTranv EPI  LIV      Birth Comments: C-section due to condyloma break out       Name: Kara Freeman     FAMILY HISTORY:  family history includes Cervical Cancer in her paternal aunt; Liver Cancer in her father; No Known Problems in her maternal  grandfather, maternal grandmother, paternal grandfather, and paternal grandmother.    SOCIAL HISTORY:    reports that she has never smoked. She has never used smokeless tobacco. She reports current drug use. Drug: Marijuana Sheran Fava). She reports that she does not drink alcohol.    MEDICAL HISTORY:  is allergic to bactrim [sulfamethoxazole-trimethoprim].  There are no problems to display for this patient.     Prior to Admission medications    Medication Sig Start Date End Date Taking? Authorizing Provider   hydrocortisone 2.5 % cream Apply topically 2 times daily. 11/19/23  Yes Aniqua Briere, Colin Mulders, APRN - CNP   metroNIDAZOLE (METROGEL) 0.75 % vaginal gel Place 1 Applicatorful vaginally nightly for 7 days 11/19/23 11/26/23 Yes Mattye Verdone, Colin Mulders, APRN - CNP   fluconazole (DIFLUCAN) 150 MG tablet Take 1 tablet by mouth once for 1 dose 11/19/23 11/19/23 Yes Austen Wygant, Colin Mulders, APRN - CNP      has a past medical history of Herpes.   has a past surgical history that includes Cesarean section (  01/24/2009); Tubal ligation; Abdominoplasty (2022); Cesarean section (08/29/2018); Cesarean section (01/05/2015); Robotic assisted hysterectomy; Salpingo-oophorectomy (Bilateral); Breast reduction surgery (Bilateral, 08/19/2023); and Cosmetic surgery (2023).                                                                                                                      Review of Systems   Genitourinary:  Positive for vaginal discharge and vaginal pain. Negative for decreased urine volume, difficulty urinating, dyspareunia, dysuria, frequency, hematuria, menstrual problem and urgency.   All other systems reviewed and are negative.       PHYSICAL EXAM:   Vitals:    11/19/23 0926   BP: 130/83   Site: Right Upper Arm   Position: Sitting   Cuff Size: Large Adult   Pulse: 84   Weight: 100.2 kg (221 lb)      Physical Exam  Constitutional:       General: She is not in acute distress.     Appearance: Normal appearance.   Genitourinary:      Vulva  and urethral meatus normal.      Genitourinary Comments: Blind swab collected       Right Labia: No tenderness, lesions or skin changes.     Left Labia: No tenderness, lesions or skin changes.     Vaginal discharge (scant, creamy) present.   Pulmonary:      Effort: Pulmonary effort is normal.   Psychiatric:         Speech: Speech normal.         Behavior: Behavior normal.   Vitals reviewed.     ASSESSMENT & PLAN:    Kara Freeman is a 33 y.o. female G3P3003 here for a problem visit  [name] was seen today for vaginal discharge.    Diagnoses and all orders for this visit:    Vaginal irritation    Recurrent vaginitis    Vaginal odor    Vaginal itching    Other orders  -     hydrocortisone 2.5 % cream; Apply topically 2 times daily.  -     metroNIDAZOLE (METROGEL) 0.75 % vaginal gel; Place 1 Applicatorful vaginally nightly for 7 days  -     fluconazole (DIFLUCAN) 150 MG tablet; Take 1 tablet by mouth once for 1 dose      -VSS  -discussed hygiene measures and boric acid    No follow-ups on file. or sooner PRN    Counseling Completed:  -Encouraged annual gynecologic evaluation.  -Discussed birth control and barrier recommendations. Discussed STD counseling and prevention.         Routine health maintenance per patients PCP encouraged    The patient was seen and evaluated face to face by Lona Kettle, APRN - CNP   11/19/2023, 10:24 AM    On this date I have spent 30 minutes   -preparing to see the patient by reviewing the electronic medical record as well as  -obtaining and/or reviewing separately obtained history,   -performing a medically  appropriate examination and/or evaluation,   -ordering medication, tests or procedures and   -counseling and educating the patient/family/caregiver,   -referring and communicating with other health care professionals,   -documenting clinical information in the electronic or other health records,   -independently interpreting results and communicating results to the patient family  caregiver  -care coordination     Please note that portions of this note may have been generated using voice recognition dictation software. Although every effort was made to ensure the accuracy of this automated transcription, some errors in transcription may have occurred. Please contact our office with any questions.

## 2023-11-20 ENCOUNTER — Telehealth: Payer: Self-pay

## 2023-11-20 MED ORDER — METRONIDAZOLE 500 MG PO TABS
500 | ORAL_TABLET | Freq: Two times a day (BID) | ORAL | 0 refills | Status: AC
Start: 2023-11-20 — End: 2023-11-27

## 2023-11-20 NOTE — Telephone Encounter (Signed)
  Follow up Call-     11/19/2023    1:16 PM  Call back number  Post procedure Call Back phone  # [phone]  Permission to leave phone message Yes     Patient questions:  Do you have a fever, pain , or abdominal swelling? No. Pain Score  0 *  Have you tolerated food without any problems? Yes.    Have you been able to return to your normal activities? Yes.    Do you have any questions about your discharge instructions: Diet   No. Medications  No. Follow up visit  No.  Do you have questions or concerns about your Care? No.  Actions: * If pain score is 4 or above: No action needed, pain <4.

## 2023-11-21 LAB — C.TRACHOMATIS N.GONORRHOEAE DNA
C. trachomatis DNA: NEGATIVE
N. gonorrhoeae DNA: NEGATIVE

## 2023-11-21 NOTE — Addendum Note (Signed)
Addended byLona Kettle on: 11/21/2023 12:04 PM     Modules accepted: Orders

## 2023-11-22 LAB — SURGICAL PATHOLOGY

## 2023-11-26 ENCOUNTER — Encounter: Payer: Self-pay | Admitting: Adult Health

## 2023-11-26 NOTE — Telephone Encounter (Signed)
FYI

## 2023-11-27 ENCOUNTER — Telehealth

## 2023-11-27 MED ORDER — FLUCONAZOLE 150 MG PO TABS
150 | ORAL_TABLET | ORAL | 0 refills | Status: AC
Start: 2023-11-27 — End: 2023-12-03

## 2023-11-27 NOTE — Telephone Encounter (Signed)
Pt called stating she has a yeast infection after taking antibiotics for another infection.      Pt requesting diflucan    Please advise

## 2023-11-28 ENCOUNTER — Encounter: Payer: Self-pay | Admitting: Gastroenterology

## 2023-12-05 ENCOUNTER — Other Ambulatory Visit: Payer: Self-pay | Admitting: Adult Health

## 2023-12-05 ENCOUNTER — Encounter: Payer: Self-pay | Admitting: Adult Health

## 2023-12-05 DIAGNOSIS — F419 Anxiety disorder, unspecified: Secondary | ICD-10-CM

## 2023-12-06 MED ORDER — BUPROPION HCL ER (XL) 300 MG PO TB24: 300.0000 mg | ORAL_TABLET | Freq: Every day | ORAL | 1 refills | Status: DC

## 2023-12-12 ENCOUNTER — Ambulatory Visit
Admit: 2023-12-12 | Discharge: 2023-12-24 | Payer: MEDICAID | Primary: Student in an Organized Health Care Education/Training Program

## 2023-12-12 VITALS — BP 122/78 | Wt 218.0 lb

## 2023-12-12 DIAGNOSIS — N898 Other specified noninflammatory disorders of vagina: Secondary | ICD-10-CM

## 2023-12-12 NOTE — Progress Notes (Signed)
 [facility] [place], [facility]  [facility] [address]  [address]  [place] [address]  Dept: [phone]     Kara Freeman is a 33 y.o. y.o. female who presents today for had concerns including Vaginal Discharge (Vag discharge and odor painful urination?). She recently Finished flagyl which seemed to help bv symptoms. Previously Tested positive 11/19/23 for trichomonas and bv    Allergies   Allergen Reactions    Bactrim [Sulfamethoxazole-Trimethoprim] Nausea And Vomiting      There is no problem list on file for this patient.     Review of Systems:  Review of Systems   Genitourinary:  Positive for vaginal discharge. Negative for decreased urine volume, difficulty urinating, dyspareunia, dysuria, frequency, hematuria, menstrual problem, urgency and vaginal pain.   All other systems reviewed and are negative.   denies vaginal odor, itching, tenderness/pain    BP 122/78 (Site: Right Upper Arm, Position: Sitting, Cuff Size: Large Adult)   Wt 98.9 kg (218 lb)   LMP 11/22/2017   BMI 38.62 kg/m   Physical Exam  Constitutional:       General: She is not in acute distress.     Appearance: Normal appearance.   Genitourinary:      Vulva and urethral meatus normal.      Right Labia: No tenderness, lesions or skin changes.     Left Labia: No tenderness, lesions or skin changes.     Vaginal discharge present.   Pulmonary:      Effort: Pulmonary effort is normal.   Psychiatric:         Speech: Speech normal.         Behavior: Behavior normal.   Vitals reviewed.        Assessment and Plan:  [name] was seen today for vaginal discharge.    Diagnoses and all orders for this visit:    Vaginal irritation  -     C.trachomatis N.gonorrhoeae DNA; Future  -     Vaginitis DNA Probe; Future    Painful urination    -urinary testing inadvertently not completed. Will reach out to patient to return for this     Will treat pending results, educated on general vulvar care/hygiene  recommendations and precautions to prevent Bv/yeast when possible.  She was also counseled on her preventative health maintenance recommendations and follow-up.  RTO annual exam and PRN    Electronically signed by Lona Kettle, APRN - CNP

## 2023-12-13 LAB — C.TRACHOMATIS N.GONORRHOEAE DNA
C. trachomatis DNA: NEGATIVE
N. gonorrhoeae DNA: NEGATIVE

## 2023-12-13 LAB — VAGINITIS DNA PROBE
Candida species: POSITIVE — AB
GARDNERELLA VAGINALIS: POSITIVE — AB
Trichomonas: NEGATIVE

## 2023-12-13 MED ORDER — METRONIDAZOLE 500 MG PO TABS
500 | ORAL_TABLET | Freq: Two times a day (BID) | ORAL | 0 refills | Status: AC
Start: 2023-12-13 — End: 2023-12-20

## 2023-12-13 MED ORDER — FLUCONAZOLE 150 MG PO TABS
150 | ORAL_TABLET | ORAL | 0 refills | Status: AC
Start: 2023-12-13 — End: 2023-12-19

## 2023-12-16 ENCOUNTER — Ambulatory Visit: Payer: No Typology Code available for payment source | Admitting: Internal Medicine

## 2023-12-24 ENCOUNTER — Encounter
Payer: MEDICAID | Attending: Student in an Organized Health Care Education/Training Program | Primary: Student in an Organized Health Care Education/Training Program

## 2023-12-30 NOTE — Telephone Encounter (Signed)
 Patient ordered probiotic but has not started it yet. Still having symptoms discharge and odor. Wants to start flagyl and diflucan. Patient states she originally talked to someone last week and never received a response.     Teodoro Jeffreys CMA

## 2024-01-07 ENCOUNTER — Telehealth: Payer: Self-pay

## 2024-01-07 ENCOUNTER — Other Ambulatory Visit (HOSPITAL_COMMUNITY): Payer: Self-pay

## 2024-01-07 NOTE — Telephone Encounter (Signed)
.  Pharmacy Patient Advocate Encounter   Received notification from Onbase that prior authorization for Wegovy 0.25 mg/0.5 ml is required/requested.   Insurance verification completed.   The patient is insured through  [employer]  .   Per test claim: PA required; PA submitted to above mentioned insurance via Fax Key/confirmation #/EOC -- Status is pending

## 2024-01-13 ENCOUNTER — Other Ambulatory Visit (HOSPITAL_COMMUNITY): Payer: Self-pay

## 2024-01-15 ENCOUNTER — Encounter: Payer: Self-pay | Admitting: Cardiology

## 2024-01-20 ENCOUNTER — Telehealth: Payer: Self-pay

## 2024-01-20 NOTE — Telephone Encounter (Signed)
 Mar/LM for patient requesting a c/b (to schedule [facility] Gen Couns referral received).

## 2024-02-02 NOTE — Progress Notes (Signed)
 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[employer],followed for mild OSA with daytime fatigue, complicated by  LVH, hx Cardiomyopathy, IBS, Dysphagia, hx GERD, PCOS, Hyperlipidemia, Obesity,  HST 09/25/23- AHI 8.1/hr, desat to 87%, body weight 232 lbs  ====================================================================================================  09/16/23- 32 yoF never smoker, married, CMA for [facility], for sleep evaluation courtesy of [place], NP with concern of fatigue Medical problem list includes LVH, hx Cardiomyopathy, IBS, Dysphagia, hx GERD, PCOS, Hyperlipidemia, Obesity,  Epworth score-9 Body weight today- 232 lbs Wakes tired, lack of energy, may wake with headache. Some cough during night. No complex parasomnias, Nocturia x 2-3.  Admits reflux. Chronic cough x years- cough drops. HOB elevated. Would like test for asthma. Omeprazole. Pending GI appt. No sleep med, 1 cup coffee.  Some snoring. No ENT surgery. Cardiology following.    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 Has seen GI about chronic cough Body weight today- 235 lbs  Needs PFT Discussed sleep study and conservative options.  Assessment and Plan:    Mild Obstructive Sleep Apnea Sleep study showed mild obstructive sleep apnea with 8 apneic episodes per hour, causing fatigue and snoring. Non-invasive treatments preferred due to orthodontic treatment and TMJ issues. - Advise weight management and positional therapy. - Suggest trial of snore tape or sleep tape. - Discuss chin straps for snoring. - Educate about Smart Nicholes Barks device, noting cost and insurance considerations. - Discuss CPAP and  BiPAP options if symptoms worsen, with CPAP as the initial recommendation. - Recommend follow-up as needed.  Temporomandibular Joint Disorder (TMJ) TMJ complicates use of oral appliances for sleep apnea. - Discuss with orthodontist regarding TMJ and sleep apnea treatment options.  Gastroesophageal Reflux Disease (GERD) with Hiatal Hernia GERD with small hiatal hernia managed by avoiding triggers and head elevation. Trelegy used for respiratory symptoms but is costly and unpleasant. Albuterol prescribed as a cost-effective alternative. - Prescribe albuterol rescue inhaler for as-needed use. - Advise dietary modifications and head elevation during sleep. - Discuss alternative inhaler options like Breztri. - Recommend zero sugar Jolly Rancher hard candies for throat soothing. - Suggest Xylimelts  for dry throat management.     ROS-see HPI   + = positive Constitutional:    weight loss, night sweats, fevers, chills, fatigue, lassitude. HEENT:    +headaches, difficulty swallowing, tooth/dental problems, sore throat,       +sneezing, itching, +ear ache, +nasal congestion, post nasal drip, snoring CV:    chest pain, orthopnea, PND, swelling in lower extremities, anasarca,                                   dizziness, +palpitations Resp:  + shortness of breath with exertion or at rest.                +productive cough,   non-productive cough, coughing up of blood.              change in color of mucus.  wheezing.   Skin:    rash or lesions. GI:  +heartburn, +indigestion, +abdominal pain, nausea, vomiting, diarrhea,  change in bowel habits, loss of appetite GU: dysuria, change in color of urine, no urgency or frequency.   flank pain. MS:   +joint pain, stiffness, decreased range of motion, back pain. Neuro-     nothing unusual Psych:  change in mood or affect.  +depression or +anxiety.   memory loss.  OBJ- Physical Exam +overweight General- Alert, Oriented, Affect-appropriate,  Distress- none acute Skin- rash-none, lesions- none, excoriation- none Lymphadenopathy- none Head- atraumatic            Eyes- Gross vision intact, PERRLA, conjunctivae and secretions clear            Ears- Hearing, canals-normal            Nose- Clear, no-Septal dev, mucus, polyps, erosion, perforation             Throat- Mallampati III , mucosa clear , drainage- none, tonsils- atrophic, +teeth Neck- flexible , trachea midline, no stridor , thyroid  nl, carotid no bruit Chest - symmetrical excursion , unlabored           Heart/CV- RRR , no murmur , no gallop  , no rub, nl s1 s2                           - JVD- none , edema- none, stasis changes- none, varices- none           Lung- clear to P&A, wheeze- none, cough- none , dullness-none, rub- none           Chest wall-  Abd-  Br/ Gen/ Rectal- Not done, not indicated Extrem- cyanosis- none, clubbing, none, atrophy- none, strength- nl Neuro- grossly intact to observation

## 2024-02-03 ENCOUNTER — Other Ambulatory Visit (HOSPITAL_COMMUNITY): Payer: Self-pay

## 2024-02-04 ENCOUNTER — Ambulatory Visit: Payer: No Typology Code available for payment source | Admitting: Internal Medicine

## 2024-02-04 ENCOUNTER — Encounter: Payer: Self-pay | Admitting: Internal Medicine

## 2024-02-04 VITALS — BP 106/64 | HR 85 | Temp 97.7°F | Ht 65.0 in | Wt 235.2 lb

## 2024-02-04 DIAGNOSIS — G4733 Obstructive sleep apnea (adult) (pediatric): Secondary | ICD-10-CM

## 2024-02-04 DIAGNOSIS — M26609 Unspecified temporomandibular joint disorder, unspecified side: Secondary | ICD-10-CM | POA: Diagnosis not present

## 2024-02-04 DIAGNOSIS — K219 Gastro-esophageal reflux disease without esophagitis: Secondary | ICD-10-CM

## 2024-02-04 DIAGNOSIS — K449 Diaphragmatic hernia without obstruction or gangrene: Secondary | ICD-10-CM

## 2024-02-04 NOTE — Patient Instructions (Signed)
 You can talk with your orthodontist about oral appliances for snoring, but it won't help while you ar dealing with braces.  Try to keep your weight down and sleep off the flat of your back to reduce snoring and minimal sleep apnea.  If sleep apnea remains an issue and daytime tiredness persists we can talk more about trying CPAP for a while.  For dry mouth-  Zero Sugar Archie Balboa Rancher hard candies as throat lozenges  OTC XyliMelts  We are here to help if you need [place].

## 2024-02-22 ENCOUNTER — Encounter: Payer: Self-pay | Admitting: Internal Medicine

## 2024-02-26 ENCOUNTER — Ambulatory Visit: Attending: Obstetrics and Gynecology

## 2024-02-26 ENCOUNTER — Ambulatory Visit

## 2024-02-26 DIAGNOSIS — Z31430 Encounter of female for testing for genetic disease carrier status for procreative management: Secondary | ICD-10-CM

## 2024-02-26 DIAGNOSIS — Z818 Family history of other mental and behavioral disorders: Secondary | ICD-10-CM

## 2024-02-26 DIAGNOSIS — Z3169 Encounter for other general counseling and advice on procreation: Secondary | ICD-10-CM

## 2024-02-26 NOTE — Progress Notes (Signed)
 [facility] for [facility] at [facility] [address] Phone:  [phone]   Fax:  [phone]      In-Person Genetic Counseling Clinic Note:   I spoke with 33 y.o. Yvette Hart for preconception counseling due to her family history of autism. She was referred by Margaretmary Shaver, MD. She was accompanied by her husband Yvette Hart.   Pregnancy History:    G1P1001. Yvette Hart and Yvette Hart are considering a future pregnancy. Personal history of PCOS, depression, anxiety, hypertrophic cardiomyopathy that was dx at 33 yo; per patient, follow-up visits with cardiology this year were normal. Reports drinking alcohol socially, 1-2x/month. Denies using tobacco or street drugs in this pregnancy. We discussed the importance of prenatal vitamins, specifically folic acid. It is recommended to take 400 mcg of folic acid daily starting at least one month prior to conception and through the first 3 months of pregnancy.   Family History:    A three-generation pedigree was created and scanned into Epic under the Media tab.  The couple has a 34 yo son who was diagnosed with level 2 autism at 2-3 yo. Hr also has ADHD. The report he has a speech delay but is verbal. He experiences verbal stimming. He has not been seen by pediatric genetics. Genetic testing for individuals with a clinical diagnosis of autism yields an explanation in only about 20% of cases, and the remaining 80% of cases are left with unknown etiology. Having an affected son may increase the chance that Yvette Hart's future children will have autism; however, without genetic testing performed on affected family members, it is difficult to assess risk to the pregnancy and other family members. The risk may be up to 50% in the case of an identified genetic cause. We also discussed and offered screening for fragile X syndrome, which is one of the most common causes of inherited intellectual disability and autism in  males. Fragile X syndrome affects females as well. We also reviewed that the most informative person to test would be their son. His pediatrician can refer him to pediatric genetics for an evaluation.  Yvette Hart reports a history of lung cancer in her mother (d. 68 yo) that metastasized to the brain; she had a history of smoking. Her 55 yo father had rectal cancer that metastasized to the prostate and bladder (dx 35 yo). Different types of cancer can have a hereditary component that increases an individual's susceptibility to develop cancer; however, most cancers occur by chance due to a combination of genetics and the environment. It is recommended Yvette Hart inform her PCP about her family history so they can discuss appropriate screening and testing options, including a possible referral to cancer genetic counseling. She reports she recently had a colonoscopy that was wnl.  We discussed certain conditions such as mental health conditions, hypertension, and diabetes are typically multifactorial and not due to a single genetic cause. However, Yvette Hart can be evaluated by his PCP has he mention he has a personal and family history of hypercholesterolemia, which can be hereditary.  Maternal ethnicity reported as White and paternal ethnicity reported as White. Denies Ashkenazi Jewish ancestry.  Family history otherwise not remarkable for consanguinity, individuals with birth defects, intellectual disability, autism spectrum disorder, multiple spontaneous abortions, still births, or unexplained neonatal death.  Carrier Screening Options:  We discussed and offered carrier screening per the ACOG Committee Opinion 691 as well as expanded carrier screening for autosomal recessive and X-linked conditions. The technical aspects,  benefits, risks, and limitations of carrier screening were discussed. We reviewed that if the patient is found to be a carrier for a genetic condition, carrier screening is available for the  partner. If both are found to be carriers for the same condition, there is a 25% chance of the pregnancy to be affected. After careful consideration of their options, the couple elected to have the Horizon 14 carrier screening test performed. This includes fragile X syndrome, which will be tested for Yvette Hart, as the lab does not screen males for this condition. The couple decided to have [place] screened as well, as it can be difficult for him to take time off work for a blood draw in the future  .   Newborn Screening. The   Newborn Screening (NBS) program will screen all newborn babies for cystic fibrosis, spinal muscular atrophy, hemoglobinopathies, and numerous other conditions.  Available Genetic Screening/Testing Options for Future Pregnancy:   We reviewed available genetic screening and testing options for a future pregnancy, including noninvasive prenatal screening (NIPS) and diagnostic testing. We discussed the technical aspects, risks, benefits, and limitations of genetic screening and diagnostic testing.    We also reviewed available preconception testing, such as PGT for IVF pregnancies if both Yvette Hart and her partner were found to be carriers for the same condition.  We also reviewed her age-related risk of having a child with a chromosome difference. We briefly discussed that the chance that a fetus would be affected with a chromosome difference increases with advanced maternal age. At this age, Yvette Hart has an approximately 1 in 179 (~0.6%) chance to have an affected child. This means that there is a 178 in 179 (~99.4%) chance that a future pregnancy would not be affected with a chromosome difference at this maternal age.    Plan of Care:   Horizon 14 carrier screening was drawn for Yvette Hart and her husband. He consented we call Yvette Hart with the results. Prenatal screening (NIPS) and CVS/amniocentesis can be offered in the event of a future pregnancy.   Informed consent was  obtained. All questions were answered.   90 minutes were spent on the date of the encounter in service to the patient including preparation, face-to-face consultation, pedigree construction, genetic risk assessment, documentation, and care coordination.    Thank you for sharing in the care of Yvette Hart with us .  Please do not hesitate to contact us  at [phone] if you have any questions.   Yvette Kindle, MS, [facility] Certified Genetic Counselor   Genetic counseling student involved in appointment: No.

## 2024-03-04 ENCOUNTER — Telehealth: Payer: Self-pay

## 2024-03-04 LAB — HORIZON 14 (PAN-ETHNIC STANDARD)
ALPHA-THALASSEMIA: NEGATIVE
BETA-HEMOGLOBINOPATHIES: NEGATIVE
CANAVAN DISEASE: NEGATIVE
CYSTIC FIBROSIS: NEGATIVE
DUCHENNE/BECKER MUSCULAR DYSTROPHY: NEGATIVE
FAMILIAL DYSAUTONOMIA: NEGATIVE
FRAGILE X SYNDROME: NEGATIVE
GALACTOSEMIA: NEGATIVE
GAUCHER DISEASE: NEGATIVE
MEDIUM CHAIN ACYL-COA DEHYDROGENASE DEFICIENCY: NEGATIVE
POLYCYSTIC KIDNEY DISEASE AUTOSOMAL RECESSIVE: NEGATIVE
REPORT SUMMARY: NEGATIVE
SMITH-LEMLI-OPITZ SYNDROME: NEGATIVE
SPINAL MUSCULAR ATROPHY: NEGATIVE
TAY-SACHS DISEASE: NEGATIVE

## 2024-03-04 NOTE — Telephone Encounter (Signed)
 Patient called back. She was not found to be a carrier for the 14 conditions screened for. This significantly reduces but does not eliminate the chance that she is a carrier for those conditions. Please see report for details.  Her husband Hayliegh Rudzinski DOB 05/14/1986 was not found to be a carrier for the 12 conditions screened for. This significantly reduces but does not eliminate the chance that he is a carrier for those conditions. Please see report for details.  Georgean Kindle, MS, [facility] Certified Genetic Counselor [facility] for [facility] [phone]

## 2024-03-04 NOTE — Telephone Encounter (Signed)
 Attempted to call patient to return her and her husband's negative Horizon carrier screening results. Left message to call back.

## 2024-03-10 ENCOUNTER — Encounter: Payer: Medicaid (Managed Care) | Primary: Student in an Organized Health Care Education/Training Program

## 2024-03-13 ENCOUNTER — Inpatient Hospital Stay: Payer: Medicaid (Managed Care) | Primary: Student in an Organized Health Care Education/Training Program

## 2024-03-13 ENCOUNTER — Ambulatory Visit
Admit: 2024-03-13 | Discharge: 2024-03-13 | Payer: Medicaid (Managed Care) | Primary: Student in an Organized Health Care Education/Training Program

## 2024-03-13 DIAGNOSIS — N760 Acute vaginitis: Secondary | ICD-10-CM

## 2024-03-13 NOTE — Progress Notes (Unsigned)
 [facility] [place], [facility]  [facility] Christin Coy  [address]  [place] [address]  Dept: [phone]     Kara Freeman is a 33 y.o. y.o. female who presents today for had no chief complaint listed for this encounter.    They are sexually active with no recent change in partner and they deny painful intercourse. Had trichomonas earlier this year, TOC ok  Contraception: coitus interruptus and hysterectomy  Medical record reviewed.     Breast reduction 8 months ago August 19 2023    Review of Systems   Genitourinary:  Positive for vaginal discharge. Negative for decreased urine volume, difficulty urinating, dyspareunia, dysuria, frequency, hematuria, menstrual problem, urgency and vaginal pain.   All other systems reviewed and are negative.  {Actions; denies/admits to:40235} vaginal odor, itching, irritation    LMP 11/22/2017   Physical Exam  Constitutional:       General: She is not in acute distress.     Appearance: Normal appearance.   Genitourinary:      Vulva and urethral meatus normal.      Right Labia: No tenderness, lesions or skin changes.     Left Labia: No tenderness, lesions or skin changes.     Vaginal discharge present.   Pulmonary:      Effort: Pulmonary effort is normal.   Psychiatric:         Speech: Speech normal.         Behavior: Behavior normal.   Vitals reviewed.     Assessment and Plan:  There are no diagnoses linked to this encounter.    -VSS   -  -Will treat pending results,   -Educated on general vulvar care/hygiene recommendations and precautions to prevent Bv/yeast when possible. Clean vulva and vagina with water and hand. Minimize soap use, recommend a small amount of [employer] baby soap to use on external vulva. Avoid washcloth use, use of strong soaps, detergents, lotions, perfumes. Do not use a washcloth to clean the vulva. Avoid douches, "feminine hygiene" products, antibacterial soap. Tub baths with plain water and no  soap/bubbles preferred. Avoid scented toilet paper and baby wipes.  -Consider Free & Clear laundry detergent, cotton underwear, sleeping with breathable/no to maximize airflow  -change clothes immediately after exercise/when sweating excessively  -Semen/intercourse, menstrual cycle are common trigger of pH fluctuation  -Avoid panty liner use. Menstrual pads or tampons are preferred  -Boric acid vaginal suppositories are available over the counter and this is a great option to promote pH balance for premenopausal, non-pregnant women without diabetes. Boric acid is Harmful if swallowed, keep out of reach of children. Avoid intercourse until boric acid is fully absorbed into the vagina. Boric acid can weaken condoms.   -Consider prophylactic use of boric acid IF NOT DIABETIC and if not past menopause when symptoms are anticipated after intercourse or before/after menstrual period.     She was also counseled on her preventative health maintenance recommendations and follow-up.  RTO annual exam and PRN    Electronically signed by Mittie Anchors, APRN - CNP

## 2024-03-13 NOTE — Patient Instructions (Signed)
Please  eschedule

## 2024-03-14 LAB — VAGINITIS DNA PROBE
Candida species: NEGATIVE
GARDNERELLA VAGINALIS: POSITIVE — AB
Trichomonas: NEGATIVE

## 2024-03-17 MED ORDER — METRONIDAZOLE 500 MG PO TABS
500 | ORAL_TABLET | Freq: Two times a day (BID) | ORAL | 0 refills | 7.00 days | Status: AC
Start: 2024-03-17 — End: 2024-03-24

## 2024-04-22 ENCOUNTER — Ambulatory Visit: Admitting: Adult Health

## 2024-04-30 ENCOUNTER — Telehealth

## 2024-04-30 NOTE — Telephone Encounter (Signed)
 LMOR to leave urine at any [facility] lab, orders have been placed.

## 2024-04-30 NOTE — Telephone Encounter (Signed)
 Pt is calling stating she has had urinary frequency and burning x3 days. Pt states she has been taking AZO for 2 days three times daily with no relief. Pt would like a urine culture ordered. Please signed pended order and add an additional testing per preference.     Please advise and route to clinical pool

## 2024-05-05 ENCOUNTER — Inpatient Hospital Stay: Payer: Medicaid (Managed Care) | Primary: Student in an Organized Health Care Education/Training Program

## 2024-05-05 DIAGNOSIS — R350 Frequency of micturition: Principal | ICD-10-CM

## 2024-05-06 ENCOUNTER — Ambulatory Visit: Admitting: Adult Health

## 2024-05-06 ENCOUNTER — Encounter: Payer: Self-pay | Admitting: Adult Health

## 2024-05-06 VITALS — BP 100/70 | HR 73 | Temp 98.6°F | Ht 65.0 in | Wt 220.0 lb

## 2024-05-06 DIAGNOSIS — F32A Depression, unspecified: Secondary | ICD-10-CM | POA: Diagnosis not present

## 2024-05-06 DIAGNOSIS — F419 Anxiety disorder, unspecified: Secondary | ICD-10-CM

## 2024-05-06 DIAGNOSIS — F418 Other specified anxiety disorders: Secondary | ICD-10-CM | POA: Diagnosis not present

## 2024-05-06 LAB — URINALYSIS WITH MICROSCOPIC
Bilirubin, Urine: NEGATIVE
Casts UA: 5 /LPF (ref 0–8)
Epithelial Cells, UA: 5 /HPF (ref 0–5)
Glucose, Ur: NEGATIVE mg/dL
Ketones, Urine: NEGATIVE mg/dL
Leukocyte Esterase, Urine: NEGATIVE
Nitrite, Urine: POSITIVE — AB
Protein, UA: NEGATIVE mg/dL
RBC, UA: 2 /HPF (ref 0–4)
Specific Gravity, UA: 1.018 (ref 1.005–1.030)
Urobilinogen, Urine: NORMAL EU/dL (ref 0.0–1.0)
WBC, UA: 5 /HPF (ref 0–5)
pH, Urine: 7 (ref 5.0–8.0)

## 2024-05-06 MED ORDER — SERTRALINE HCL 50 MG PO TABS: 50.0000 mg | ORAL_TABLET | Freq: Every day | ORAL | 3 refills | Status: DC

## 2024-05-06 NOTE — Progress Notes (Signed)
 Subjective:    Patient ID: Yvette Hart, female    DOB: 1991/09/12, 33 y.o.   MRN: [id number]  HPI  33 year old female who  has a past medical history of Anxiety, Back pain, Breast discharge, Cardiomyopathy (HCC), Chest pain, Chicken pox, Constipation, Depression, Diabetes mellitus without complication (HCC), Fatty liver, Food allergy, Gallbladder problem, GERD (gastroesophageal reflux disease), Heart valve problem, Hepatic artery stenosis (HCC), Hip pain, History of swelling of feet, Hyperlipidemia, IBS (irritable bowel syndrome), Joint pain, Lactose intolerance, Neck pain, Obesity, Osteoarthritis, Palpitations, PCOS (polycystic ovarian syndrome), SOB (shortness of breath), Stomach ulcer, SVD (spontaneous vaginal delivery) (01/26/2018), Swallowing difficulty, Urinary incontinence, and Vitamin D  deficiency.  She presents to the office today for anxiety and depression.  She was last seen in August 2024 at which time she was continued on Wellbutrin  300 mg extended release daily.  She felt as though this medication was working well for her symptoms but as of recently it does not seem to be working any longer.  She has significant anxiety, depression, and worry.  She feels as though it is starting to take a toll on her family life and she wants to be more present for them.     05/06/2024    3:33 PM 06/19/2023    8:35 AM 04/24/2023    9:21 AM  PHQ9 SCORE ONLY  PHQ-9 Total Score 14 10 3       05/06/2024    3:33 PM 06/19/2023    8:36 AM 04/24/2023    9:21 AM 03/13/2023   10:53 AM  GAD 7 : Generalized Anxiety Score  Nervous, Anxious, on Edge 3 2 0 2  Control/stop worrying 3 2 0 3  Worry too much - different things 3 3 0 3  Trouble relaxing 2 1 0 2  Restless 1 1 0 0  Easily annoyed or irritable 3 3 1 3   Afraid - awful might happen 2 3 0 1  Total GAD 7 Score 17 15 1 14   Anxiety Difficulty Extremely difficult Not difficult at all Not difficult at all Somewhat difficult     Review of Systems See HPI    Past Medical History:  Diagnosis Date   Anxiety    Back pain    Breast discharge    Cardiomyopathy (HCC)    monitored by [facility] [place] pt bringing records   Chest pain    Chicken pox    Constipation    Depression    Diabetes mellitus without complication (HCC)    Fatty liver    Food allergy    Gallbladder problem    GERD (gastroesophageal reflux disease)    Heart valve problem    Hepatic artery stenosis (HCC)    Hip pain    History of swelling of feet    Hyperlipidemia    IBS (irritable bowel syndrome)    Joint pain    Lactose intolerance    Neck pain    Obesity    Osteoarthritis    Palpitations    PCOS (polycystic ovarian syndrome)    SOB (shortness of breath)    Stomach ulcer    SVD (spontaneous vaginal delivery) 01/26/2018   Swallowing difficulty    Urinary incontinence    Vitamin D  deficiency     Social History   Socioeconomic History   Marital status: Married    Spouse name: Not on file   Number of children: Not on file   Years of education: Not on file  Highest education level: Associate degree: occupational, Scientist, product/process development, or vocational program  Occupational History   Not on file  Tobacco Use   Smoking status: Never   Smokeless tobacco: Never  Vaping Use   Vaping status: Never Used  Substance and Sexual Activity   Alcohol use: Yes    Alcohol/week: 3.0 standard drinks of alcohol    Types: 1 Glasses of wine, 1 Cans of beer, 1 Shots of liquor per week    Comment: social ONCE A MONTH   Drug use: No   Sexual activity: Yes    Birth control/protection: None  Other Topics Concern   Not on file  Social History Narrative   Not on file   Social Drivers of Health   Financial Resource Strain: Medium Risk (04/24/2023)   Overall Financial Resource Strain (CARDIA)    Difficulty of Paying Living Expenses: Somewhat hard  Food Insecurity: No Food Insecurity (04/24/2023)   Hunger Vital Sign    Worried About Running Out of Food in the Last  Year: Never true    Ran Out of Food in the Last Year: Never true  Transportation Needs: No Transportation Needs (04/24/2023)   PRAPARE - Administrator, Civil Service (Medical): No    Lack of Transportation (Non-Medical): No  Physical Activity: Insufficiently Active (04/24/2023)   Exercise Vital Sign    Days of Exercise per Week: 1 day    Minutes of Exercise per Session: 20 min  Stress: No Stress Concern Present (04/24/2023)   [employer] of Occupational Health - Occupational Stress Questionnaire    Feeling of Stress : Only a little  Social Connections: Moderately Integrated (04/24/2023)   Social Connection and Isolation Panel    Frequency of Communication with Friends and Family: Twice a week    Frequency of Social Gatherings with Friends and Family: Once a week    Attends Religious Services: More than 4 times per year    Active Member of [employer] or Organizations: No    Attends Engineer, structural: Not on file    Marital Status: Married  Catering manager Violence: Not on file    Past Surgical History:  Procedure Laterality Date   NO PAST SURGERIES      Family History  Problem Relation Age of Onset   Depression Mother    Miscarriages / [place] Mother        TWICE   Cancer Mother    Rectal cancer Father    Hypertension Father    Cancer Father    Mental illness Sister    Depression Sister    Asthma Brother    Depression Brother    Cervical cancer Paternal Aunt    Ovarian cancer Paternal Aunt    Stroke Maternal Grandmother    Heart disease Paternal Grandmother    Breast cancer Paternal Grandmother    Diabetes Paternal Grandmother    COPD Paternal Grandmother    Heart attack Paternal Grandmother    Hypertension Paternal Grandmother    Colon polyps Paternal Grandfather    Colon cancer Paternal Grandfather    Crohn's disease Cousin    Esophageal cancer Neg Hx    Pancreatic cancer Neg Hx    Stomach cancer Neg Hx     Allergies  Allergen  Reactions   Latex Rash   Onion Nausea And Vomiting and Other (See Comments)    Upset stomache    Current Outpatient Medications on File Prior to Visit  Medication Sig Dispense Refill   acetaminophen  (TYLENOL )  500 MG tablet Take 500-1,000 mg by mouth every 6 (six) hours as needed for mild pain (pain score 1-3) or headache.     buPROPion  (WELLBUTRIN  XL) 300 MG 24 hr tablet Take 1 tablet (300 mg total) by mouth daily. 90 tablet 1   Fluticasone-Umeclidin-Vilant (TRELEGY ELLIPTA ) 100-62.5-25 MCG/ACT AEPB Inhale 1 puff into the lungs daily. Rinse mouth after (Patient taking differently: Inhale 1 puff into the lungs daily as needed. Rinse mouth after) 60 each 3   levonorgestrel (MIRENA, 52 MG,) 20 MCG/DAY IUD Take 1 device by intrauterine route.     metFORMIN  (GLUCOPHAGE ) 500 MG tablet Take 1 tablet (500 mg total) by mouth 2 (two) times daily with a meal. 60 tablet 0   No current facility-administered medications on file prior to visit.    BP 100/70   Pulse 73   Temp 98.6 F (37 C) (Oral)   Ht 5' 5 (1.651 m)   Wt 220 lb (99.8 kg)   SpO2 98%   BMI 36.61 kg/m       Objective:   Physical Exam Vitals and nursing note reviewed.  Constitutional:      Appearance: Normal appearance.  Cardiovascular:     Rate and Rhythm: Normal rate and regular rhythm.     Pulses: Normal pulses.     Heart sounds: Normal heart sounds.  Pulmonary:     Effort: Pulmonary effort is normal.     Breath sounds: Normal breath sounds.  Skin:    General: Skin is warm and dry.  Neurological:     General: No focal deficit present.     Mental Status: She is alert and oriented to person, place, and time.  Psychiatric:        Mood and Affect: Mood normal.        Behavior: Behavior normal.        Thought Content: Thought content normal.        Judgment: Judgment normal.        Assessment & Plan:  1. Anxiety and depression (Primary) - I am going to add Zoloft  50 mg to her regimen.  Will have her follow-up in  30 days or sooner if needed. -If she is not improving then we will refer her to psychiatry. - sertraline  (ZOLOFT ) 50 MG tablet; Take 1 tablet (50 mg total) by mouth daily.  Dispense: 30 tablet; Refill: 3  Susy Placzek, NP

## 2024-05-07 ENCOUNTER — Encounter

## 2024-05-07 LAB — CULTURE, URINE

## 2024-05-07 MED ORDER — NITROFURANTOIN MONOHYD MACRO 100 MG PO CAPS
100 | ORAL_CAPSULE | Freq: Two times a day (BID) | ORAL | 0 refills | 5.00000 days | Status: AC
Start: 2024-05-07 — End: 2024-05-12

## 2024-05-07 NOTE — Other (Signed)
Her urine culture showed a UTI and I have sent a rx for abx to her pharmacy. Please let her know

## 2024-05-08 ENCOUNTER — Encounter

## 2024-05-08 MED ORDER — ONDANSETRON 4 MG PO TBDP
4 | ORAL_TABLET | Freq: Three times a day (TID) | ORAL | 0 refills | 7.00000 days | Status: DC | PRN
Start: 2024-05-08 — End: 2024-10-26

## 2024-05-08 NOTE — Telephone Encounter (Signed)
 Pt calling was prescribed Macrobid  for UTI & states it's making her vomit can we call in some Zofran 

## 2024-05-28 ENCOUNTER — Encounter: Payer: Medicaid (Managed Care) | Primary: Student in an Organized Health Care Education/Training Program

## 2024-06-02 ENCOUNTER — Other Ambulatory Visit: Payer: Self-pay | Admitting: Dentistry

## 2024-06-02 DIAGNOSIS — M26632 Articular disc disorder of left temporomandibular joint: Secondary | ICD-10-CM

## 2024-06-19 ENCOUNTER — Ambulatory Visit
Admission: RE | Admit: 2024-06-19 | Discharge: 2024-06-19 | Disposition: A | Discharge status: Home / Self Care | Source: Ambulatory Visit | Attending: Dentistry | Admitting: Dentistry

## 2024-06-19 DIAGNOSIS — M26632 Articular disc disorder of left temporomandibular joint: Secondary | ICD-10-CM

## 2024-07-01 ENCOUNTER — Ambulatory Visit (INDEPENDENT_AMBULATORY_CARE_PROVIDER_SITE_OTHER): Admitting: Adult Health

## 2024-07-01 ENCOUNTER — Encounter: Payer: Self-pay | Admitting: Adult Health

## 2024-07-01 ENCOUNTER — Encounter: Admitting: Adult Health

## 2024-07-01 VITALS — BP 110/62 | HR 99 | Temp 98.7°F | Ht 66.0 in | Wt 207.0 lb

## 2024-07-01 DIAGNOSIS — Z6833 Body mass index (BMI) 33.0-33.9, adult: Secondary | ICD-10-CM | POA: Diagnosis not present

## 2024-07-01 DIAGNOSIS — F419 Anxiety disorder, unspecified: Secondary | ICD-10-CM | POA: Diagnosis not present

## 2024-07-01 DIAGNOSIS — Z0000 Encounter for general adult medical examination without abnormal findings: Secondary | ICD-10-CM | POA: Diagnosis not present

## 2024-07-01 DIAGNOSIS — E66811 Obesity, class 1: Secondary | ICD-10-CM | POA: Diagnosis not present

## 2024-07-01 DIAGNOSIS — G4733 Obstructive sleep apnea (adult) (pediatric): Secondary | ICD-10-CM

## 2024-07-01 DIAGNOSIS — F32A Depression, unspecified: Secondary | ICD-10-CM

## 2024-07-01 DIAGNOSIS — E282 Polycystic ovarian syndrome: Secondary | ICD-10-CM | POA: Diagnosis not present

## 2024-07-01 MED ORDER — BUPROPION HCL ER (XL) 300 MG PO TB24: 300.0000 mg | ORAL_TABLET | Freq: Every day | ORAL | 1 refills | Status: AC

## 2024-07-01 MED ORDER — SERTRALINE HCL 50 MG PO TABS: 50.0000 mg | ORAL_TABLET | Freq: Every day | ORAL | 1 refills | Status: DC

## 2024-07-01 NOTE — Progress Notes (Signed)
 Subjective:    Patient ID: Yvette Hart, female    DOB: 1990/12/04, 34 y.o.   MRN: [id number]  HPI Patient presents for yearly preventative medicine examination. She is a pleasant 33 year old female who  has a past medical history of Anxiety, Back pain, Breast discharge, Cardiomyopathy (HCC), Chest pain, Chicken pox, Constipation, Depression, Diabetes mellitus without complication (HCC), Fatty liver, Food allergy, Gallbladder problem, GERD (gastroesophageal reflux disease), Heart valve problem, Hepatic artery stenosis (HCC), Hip pain, History of swelling of feet, Hyperlipidemia, IBS (irritable bowel syndrome), Joint pain, Lactose intolerance, Neck pain, Obesity, Osteoarthritis, Palpitations, PCOS (polycystic ovarian syndrome), SOB (shortness of breath), Stomach ulcer, SVD (spontaneous vaginal delivery) (01/26/2018), Swallowing difficulty, Urinary incontinence, and Vitamin D  deficiency.  Anxiety and depression.  She was last seen in July 2025 at which time she was continued on Wellbutrin  300 mg extended release daily and Zoloft  50 mg was added to her regimen as  she has significant anxiety, depression, and worry.  She felt as though it is starting to take a toll on her family life and she wants to be more present for them. She reports that she is tolerating Zoloft  well and feels better. She wants to keep her dose the same.     07/01/2024    3:02 PM 05/06/2024    3:33 PM 06/19/2023    8:35 AM  PHQ9 SCORE ONLY  PHQ-9 Total Score 4 14  10       Data saved with a previous flowsheet row definition      07/01/2024    3:04 PM 05/06/2024    3:33 PM 06/19/2023    8:36 AM 04/24/2023    9:21 AM  GAD 7 : Generalized Anxiety Score  Nervous, Anxious, on Edge 3 3 2  0  Control/stop worrying 3 3 2  0  Worry too much - different things 3 3 3  0  Trouble relaxing 0 2 1 0  Restless 0 1 1 0  Easily annoyed or irritable 3 3 3 1   Afraid - awful might happen 0 2 3 0  Total GAD 7 Score 12 17 15 1   Anxiety Difficulty  Somewhat difficult Extremely difficult Not difficult at all Not difficult at all    Obesity - has had trouble with weight loss in the past. She has tried the weight loss clinic at [facility]. Has been tried on Topamax, Victoza, and Phentermine with minimal results.  She has not been able to tolerate phentermine as it causes nausea  OSA-her home sleep study November 2024 showed mild obstructive sleep apnea with 8 apneic episodes per hour causing fatigue and snoring.  She was advised on noninvasive treatments such as weight management, trial of slower taper sleep today prior to CPAP therapy.  PCOS - managed by GYN with Metformin  500 mg daily.   All immunizations and health maintenance protocols were reviewed with the patient and needed orders were placed.  Appropriate screening laboratory values were ordered for the patient including screening of hyperlipidemia, renal function and hepatic function.  Medication reconciliation,  past medical history, social history, problem list and allergies were reviewed in detail with the patient  Goals were established with regard to weight loss, exercise, and  diet in compliance with medications. She is exercising when she can and is eating healthy.   Wt Readings from Last 3 Encounters:  07/01/24 207 lb (93.9 kg)  05/06/24 220 lb (99.8 kg)  02/04/24 235 lb 3.2 oz (106.7 kg)   Review of Systems  Constitutional: Negative.  HENT: Negative.    Eyes: Negative.   Respiratory: Negative.    Cardiovascular: Negative.   Gastrointestinal: Negative.   Endocrine: Negative.   Genitourinary: Negative.   Musculoskeletal: Negative.   Skin: Negative.   Allergic/Immunologic: Negative.   Neurological: Negative.   Hematological: Negative.   Psychiatric/Behavioral: Negative.     Past Medical History:  Diagnosis Date   Anxiety    Back pain    Breast discharge    Cardiomyopathy (HCC)    monitored by [facility] medical [place] pt bringing records   Chest pain     Chicken pox    Constipation    Depression    Diabetes mellitus without complication (HCC)    Fatty liver    Food allergy    Gallbladder problem    GERD (gastroesophageal reflux disease)    Heart valve problem    Hepatic artery stenosis (HCC)    Hip pain    History of swelling of feet    Hyperlipidemia    IBS (irritable bowel syndrome)    Joint pain    Lactose intolerance    Neck pain    Obesity    Osteoarthritis    Palpitations    PCOS (polycystic ovarian syndrome)    SOB (shortness of breath)    Stomach ulcer    SVD (spontaneous vaginal delivery) 01/26/2018   Swallowing difficulty    Urinary incontinence    Vitamin D  deficiency     Social History   Socioeconomic History   Marital status: Married    Spouse name: Not on file   Number of children: Not on file   Years of education: Not on file   Highest education level: Associate degree: occupational, Scientist, product/process development, or vocational program  Occupational History   Not on file  Tobacco Use   Smoking status: Never   Smokeless tobacco: Never  Vaping Use   Vaping status: Never Used  Substance and Sexual Activity   Alcohol use: Yes    Alcohol/week: 3.0 standard drinks of alcohol    Types: 1 Glasses of wine, 1 Cans of beer, 1 Shots of liquor per week    Comment: social ONCE A MONTH   Drug use: No   Sexual activity: Yes    Birth control/protection: None  Other Topics Concern   Not on file  Social History Narrative   Not on file   Social Drivers of Health   Financial Resource Strain: Medium Risk (04/24/2023)   Overall Financial Resource Strain (CARDIA)    Difficulty of Paying Living Expenses: Somewhat hard  Food Insecurity: No Food Insecurity (04/24/2023)   Hunger Vital Sign    Worried About Running Out of Food in the Last Year: Never true    Ran Out of Food in the Last Year: Never true  Transportation Needs: No Transportation Needs (04/24/2023)   PRAPARE - Administrator, Civil Service (Medical): No     Lack of Transportation (Non-Medical): No  Physical Activity: Insufficiently Active (04/24/2023)   Exercise Vital Sign    Days of Exercise per Week: 1 day    Minutes of Exercise per Session: 20 min  Stress: No Stress Concern Present (04/24/2023)   [employer] of Occupational Health - Occupational Stress Questionnaire    Feeling of Stress : Only a little  Social Connections: Moderately Integrated (04/24/2023)   Social Connection and Isolation Panel    Frequency of Communication with Friends and Family: Twice a week    Frequency of Social Gatherings with  Friends and Family: Once a week    Attends Religious Services: More than 4 times per year    Active Member of [employer] or Organizations: No    Attends Engineer, structural: Not on file    Marital Status: Married  Catering manager Violence: Not on file    Past Surgical History:  Procedure Laterality Date   NO PAST SURGERIES      Family History  Problem Relation Age of Onset   Depression Mother    Miscarriages / Stillbirths Mother        TWICE   Cancer Mother    Rectal cancer Father    Hypertension Father    Cancer Father    Mental illness Sister    Depression Sister    Asthma Brother    Depression Brother    Cervical cancer Paternal Aunt    Ovarian cancer Paternal Aunt    Stroke Maternal Grandmother    Heart disease Paternal Grandmother    Breast cancer Paternal Grandmother    Diabetes Paternal Grandmother    COPD Paternal Grandmother    Heart attack Paternal Grandmother    Hypertension Paternal Grandmother    Colon polyps Paternal Grandfather    Colon cancer Paternal Grandfather    Crohn's disease Cousin    Esophageal cancer Neg Hx    Pancreatic cancer Neg Hx    Stomach cancer Neg Hx     Allergies  Allergen Reactions   Latex Rash   Onion Nausea And Vomiting and Other (See Comments)    Upset stomache    Current Outpatient Medications on File Prior to Visit  Medication Sig Dispense Refill   buPROPion   (WELLBUTRIN  XL) 300 MG 24 hr tablet Take 1 tablet (300 mg total) by mouth daily. 90 tablet 1   Fluticasone-Umeclidin-Vilant (TRELEGY ELLIPTA ) 100-62.5-25 MCG/ACT AEPB Inhale 1 puff into the lungs daily. Rinse mouth after (Patient taking differently: Inhale 1 puff into the lungs daily as needed. Rinse mouth after) 60 each 3   levonorgestrel (MIRENA, 52 MG,) 20 MCG/DAY IUD Take 1 device by intrauterine route.     metFORMIN  (GLUCOPHAGE ) 500 MG tablet Take 1 tablet (500 mg total) by mouth 2 (two) times daily with a meal. 60 tablet 0   sertraline  (ZOLOFT ) 50 MG tablet Take 1 tablet (50 mg total) by mouth daily. 30 tablet 3   No current facility-administered medications on file prior to visit.    BP 110/62   Pulse 99   Temp 98.7 F (37.1 C) (Oral)   Ht 5' 6 (1.676 m)   Wt 207 lb (93.9 kg)   SpO2 97%   BMI 33.41 kg/m       Objective:   Physical Exam Vitals and nursing note reviewed.  Constitutional:      General: She is not in acute distress.    Appearance: Normal appearance. She is not ill-appearing.  HENT:     Head: Normocephalic and atraumatic.     Right Ear: Tympanic membrane, ear canal and external ear normal. There is no impacted cerumen.     Left Ear: Tympanic membrane, ear canal and external ear normal. There is no impacted cerumen.     Nose: Nose normal. No congestion or rhinorrhea.     Mouth/Throat:     Mouth: Mucous membranes are moist.     Pharynx: Oropharynx is clear.  Eyes:     Extraocular Movements: Extraocular movements intact.     Conjunctiva/sclera: Conjunctivae normal.     Pupils: Pupils are  equal, round, and reactive to light.  Neck:     Vascular: No carotid bruit.  Cardiovascular:     Rate and Rhythm: Normal rate and regular rhythm.     Pulses: Normal pulses.     Heart sounds: No murmur heard.    No friction rub. No gallop.  Pulmonary:     Effort: Pulmonary effort is normal.     Breath sounds: Normal breath sounds.  Abdominal:     General: Abdomen is  flat. Bowel sounds are normal. There is no distension.     Palpations: Abdomen is soft. There is no mass.     Tenderness: There is no abdominal tenderness. There is no guarding or rebound.     Hernia: No hernia is present.  Musculoskeletal:        General: Normal range of motion.     Cervical back: Normal range of motion and neck supple.  Lymphadenopathy:     Cervical: No cervical adenopathy.  Skin:    General: Skin is warm and dry.     Capillary Refill: Capillary refill takes less than 2 seconds.  Neurological:     General: No focal deficit present.     Mental Status: She is alert and oriented to person, place, and time.  Psychiatric:        Mood and Affect: Mood normal.        Behavior: Behavior normal.        Thought Content: Thought content normal.        Judgment: Judgment normal.        Assessment & Plan:  1. Routine general medical examination at a health care facility (Primary) Today patient counseled on age appropriate routine health concerns for screening and prevention, each reviewed and up to date or declined. Immunizations reviewed and up to date or declined. Labs ordered and reviewed. Risk factors for depression reviewed and negative. Hearing function and visual acuity are intact. ADLs screened and addressed as needed. Functional ability and level of safety reviewed and appropriate. Education, counseling and referrals performed based on assessed risks today. Patient provided with a copy of personalized plan for preventive services. - Follow up in one year or sooner if needed  2. Anxiety and depression - Better controlled. No change in medication  - buPROPion  (WELLBUTRIN  XL) 300 MG 24 hr tablet; Take 1 tablet (300 mg total) by mouth daily.  Dispense: 90 tablet; Refill: 1 - sertraline  (ZOLOFT ) 50 MG tablet; Take 1 tablet (50 mg total) by mouth daily.  Dispense: 90 tablet; Refill: 1  3. Class 1 obesity - Continue to eat healthy and exercise - CBC with  Differential/Platelet; Future - Comprehensive metabolic panel with GFR; Future - Lipid panel; Future - TSH; Future  4. BMI 33.0-33.9,adult  - CBC with Differential/Platelet; Future - Comprehensive metabolic panel with GFR; Future - Lipid panel; Future - TSH; Future  5. OSA (obstructive sleep apnea) - Continue with weight loss measures  - CBC with Differential/Platelet; Future - Comprehensive metabolic panel with GFR; Future - Lipid panel; Future - TSH; Future  6. PCOS (polycystic ovarian syndrome) - Per GYN  - CBC with Differential/Platelet; Future - Comprehensive metabolic panel with GFR; Future - Lipid panel; Future - TSH; Future   Darleene Shape, NP

## 2024-07-02 ENCOUNTER — Ambulatory Visit: Payer: Self-pay | Admitting: Adult Health

## 2024-07-02 LAB — CBC WITH DIFFERENTIAL/PLATELET
Basophils Absolute: 0.1 K/uL (ref 0.0–0.1)
Basophils Relative: 1 % (ref 0.0–3.0)
Eosinophils Absolute: 0.1 K/uL (ref 0.0–0.7)
Eosinophils Relative: 1.2 % (ref 0.0–5.0)
HCT: 41.7 % (ref 36.0–46.0)
Hemoglobin: 14.1 g/dL (ref 12.0–15.0)
Lymphocytes Relative: 28 % (ref 12.0–46.0)
Lymphs Abs: 1.8 K/uL (ref 0.7–4.0)
MCHC: 33.8 g/dL (ref 30.0–36.0)
MCV: 88.8 fl (ref 78.0–100.0)
Monocytes Absolute: 0.5 K/uL (ref 0.1–1.0)
Monocytes Relative: 7.9 % (ref 3.0–12.0)
Neutro Abs: 4 K/uL (ref 1.4–7.7)
Neutrophils Relative %: 61.9 % (ref 43.0–77.0)
Platelets: 230 K/uL (ref 150.0–400.0)
RBC: 4.7 Mil/uL (ref 3.87–5.11)
RDW: 12.8 % (ref 11.5–15.5)
WBC: 6.4 K/uL (ref 4.0–10.5)

## 2024-07-02 LAB — LIPID PANEL
Cholesterol: 159 mg/dL (ref 0–200)
HDL: 47.3 mg/dL (ref >39.00)
LDL Cholesterol: 92 mg/dL (ref 0–99)
NonHDL: 112.06
Total CHOL/HDL Ratio: 3
Triglycerides: 100 mg/dL (ref 0.0–149.0)
VLDL: 20 mg/dL (ref 0.0–40.0)

## 2024-07-02 LAB — COMPREHENSIVE METABOLIC PANEL WITH GFR
ALT: 19 U/L (ref 0–35)
AST: 20 U/L (ref 0–37)
Albumin: 4.3 g/dL (ref 3.5–5.2)
Alkaline Phosphatase: 57 U/L (ref 39–117)
BUN: 13 mg/dL (ref 6–23)
CO2: 29 meq/L (ref 19–32)
Calcium: 9.1 mg/dL (ref 8.4–10.5)
Chloride: 104 meq/L (ref 96–112)
Creatinine, Ser: 0.92 mg/dL (ref 0.40–1.20)
GFR: 80.68 mL/min (ref >60.00)
Glucose, Bld: 99 mg/dL (ref 70–99)
Potassium: 4.3 meq/L (ref 3.5–5.1)
Sodium: 141 meq/L (ref 135–145)
Total Bilirubin: 1.3 mg/dL — ABNORMAL HIGH (ref 0.2–1.2)
Total Protein: 6.8 g/dL (ref 6.0–8.3)

## 2024-07-02 LAB — TSH: TSH: 0.62 u[IU]/mL (ref 0.35–5.50)

## 2024-10-26 ENCOUNTER — Inpatient Hospital Stay: Payer: Medicaid (Managed Care) | Primary: Student in an Organized Health Care Education/Training Program

## 2024-10-26 ENCOUNTER — Ambulatory Visit
Admit: 2024-10-26 | Discharge: 2024-10-26 | Payer: Medicaid (Managed Care) | Attending: Nurse Practitioner | Primary: Student in an Organized Health Care Education/Training Program

## 2024-10-26 VITALS — BP 135/89 | HR 93 | Ht 62.0 in | Wt 223.0 lb

## 2024-10-26 DIAGNOSIS — N898 Other specified noninflammatory disorders of vagina: Principal | ICD-10-CM

## 2024-10-26 NOTE — Progress Notes (Signed)
 "     [facility] [place], [facility]  [facility]  [address]  [place] [address]  Dept: [phone]  Dept Fax: [phone]         10/26/2024  Kara Freeman       Chief Complaint  Chief Complaint   Patient presents with    Vaginal Itching    Vaginal Discharge    .    Blood pressure 135/89, pulse 93, height 1.575 m (5' 2), weight 101.2 kg (223 lb), last menstrual period 11/22/2017, not currently breastfeeding.    HPI:     Kara Freeman  1991/02/24 is a 33 y.o. G3P3003 here today for evaluation of ongoing vaginal discharge with odor      OB History   Gravida Para Term Preterm AB Living   3 3 3  0 0 3   SAB IAB Ectopic Molar Multiple Live Births   0 0 0 0 0 3      # Outcome Date GA Lbr Len/2nd Weight Sex Type Anes PTL Lv   3 Term 08/29/18 103w1d  3.62 kg (7 lb 15.7 oz) M CS-LTranv Spinal N LIV      Apgar1: 9  Apgar5: 9   2 Term 01/05/15 [redacted]w[redacted]d  2.892 kg (6 lb 6 oz) F CS-LTranv Spinal N LIV      Birth Comments: labor,msaf, rpt, [name]      Name: [name]      Apgar1: 8  Apgar5: 9   1 Term 01/24/09 [redacted]w[redacted]d  2.835 kg (6 lb 4 oz) F CS-LTranv EPI  LIV      Birth Comments: C-section due to condyloma break out       Name: [name]       Past Medical History:   Diagnosis Date    Herpes        Past Surgical History:   Procedure Laterality Date    ABDOMINOPLASTY  2022    BREAST REDUCTION SURGERY Bilateral 08/19/2023    CESAREAN SECTION  01/24/2009    CESAREAN SECTION  08/29/2018    CESAREAN SECTION  01/05/2015    COSMETIC SURGERY  2023    BBO    ROBOTIC ASSISTED HYSTERECTOMY      SALPINGO-OOPHORECTOMY Bilateral     TUBAL LIGATION         Family History   Problem Relation Age of Onset    Liver Cancer Father     No Known Problems Maternal Grandmother     No Known Problems Maternal Grandfather     No Known Problems Paternal Grandmother     No Known Problems Paternal Grandfather     Cervical Cancer Paternal Aunt        Social History     Socioeconomic History    Marital status:  Single     Spouse name: Not on file    Number of children: Not on file    Years of education: Not on file    Highest education level: Not on file   Occupational History    Not on file   Tobacco Use    Smoking status: Never    Smokeless tobacco: Never   Vaping Use    Vaping status: Never Used   Substance and Sexual Activity    Alcohol use: No    Drug use: Yes     Types: Marijuana Oda)    Sexual activity: Yes     Birth control/protection: Surgical     Comment:  tubal/hyst   Other Topics Concern    Not on file   Social History Narrative    Not on file     Social Drivers of Health     Financial Resource Strain: Not on file   Food Insecurity: No Food Insecurity (08/19/2023)    Received from [facility]    Hunger Screening     Within the past 12 months we worried whether our food would run out before we got money to buy more.: Never True     Within the past 12 months the food we bought just didn't last and we didn't have money to get more.: Never True   Transportation Needs: No Transportation Needs (08/19/2023)    Received from [facility]    PRAPARE - Transportation     Lack of Transportation (Medical): No     Lack of Transportation (Non-Medical): No   Physical Activity: Not on file   Stress: Not on file   Social Connections: Not on file   Intimate Partner Violence: Not on file   Housing Stability: Low Risk (08/19/2023)    Received from [facility]    Housing Instability     Are you worried or concerned that in the next two months you may not have stable housing that you own, rent or stay in as a part of a household?: No       No data recorded     **If either question is answered in a  positive fashion then complete the PHQ9 Scoring Evaluation and make the appropriate referral**    MEDICATIONS:  No current outpatient medications on file.     No current facility-administered medications for this visit.         ALLERGIES:  Allergies as of 10/26/2024 - Fully Reviewed 10/26/2024    Allergen Reaction Noted    Bactrim  [sulfamethoxazole -trimethoprim ] Nausea And Vomiting 01/13/2015         REVIEW OF SYSTEMS:        Review of Systems   Constitutional: Negative.    HENT: Negative.     Respiratory: Negative.     Cardiovascular: Negative.    Gastrointestinal: Negative.    Genitourinary:  Positive for vaginal discharge (with itching and odor).   Musculoskeletal: Negative.    Skin: Negative.    Psychiatric/Behavioral: Negative.              Chaperone for Intimate Exam  Chaperone was offered as part of the rooming process. Patient declined and agrees to continue with exam without a chaperone.           Physical Exam  Constitutional:       Appearance: Normal appearance.   HENT:      Head: Normocephalic and atraumatic.   Pulmonary:      Effort: Pulmonary effort is normal.   Genitourinary:     General: Normal vulva.      Vagina: Vaginal discharge (small amount of adherent white) present.   Musculoskeletal:         General: Normal range of motion.      Cervical back: Neck supple.   Skin:     General: Skin is warm and dry.   Neurological:      Mental Status: She is alert.   Psychiatric:         Mood and Affect: Mood normal.                Assessment/Plan   Diagnosis Orders   1.  Vaginal discharge  Vaginitis DNA Probe        1. Vaginal discharge  -     Vaginitis DNA Probe; Future         Return in about 1 year (around 10/26/2025) for annual exam.        Electronically signed by Doyal LITTIE Lied, APRN - CNP 10/26/2024 2:36 PM   "

## 2024-10-27 ENCOUNTER — Encounter

## 2024-10-27 LAB — VAGINITIS DNA PROBE
Candida species: POSITIVE — AB
GARDNERELLA VAGINALIS: POSITIVE — AB
Trichomonas: NEGATIVE

## 2024-10-27 MED ORDER — FLUCONAZOLE 150 MG PO TABS
150 | ORAL_TABLET | ORAL | 0 refills | 4.00000 days | Status: AC
Start: 2024-10-27 — End: 2024-10-30

## 2024-10-27 MED ORDER — METRONIDAZOLE 500 MG PO TABS
500 | ORAL_TABLET | Freq: Two times a day (BID) | ORAL | 0 refills | 7.00000 days | Status: AC
Start: 2024-10-27 — End: 2024-11-03

## 2024-10-27 NOTE — Result Encounter Note (Signed)
 Her swab showed both yeast and bv and I have sent a rx for both flagyl  and diflucan  to her pharmacy. Please let her know

## 2024-11-26 ENCOUNTER — Telehealth: Payer: Self-pay

## 2024-11-26 DIAGNOSIS — F419 Anxiety disorder, unspecified: Secondary | ICD-10-CM

## 2024-11-26 NOTE — Telephone Encounter (Signed)
 Copied from CRM [id number]. Topic: Clinical - Medication Refill >> Nov 26, 2024  4:22 PM Drema MATSU wrote: Medication: sertraline  (ZOLOFT ) 50 MG tablet  Has the patient contacted their pharmacy? Yes (Agent: If no, request that the patient contact the pharmacy for the refill. If patient does not wish to contact the pharmacy document the reason why and proceed with request.) issue with prescription. (Agent: If yes, when and what did the pharmacy advise?)  This is the patient's preferred pharmacy:  [id number] - [place], [place] - [address] AT [facility] OF [address] [place] [address] Phone: [phone] Fax: [phone]  Is this the correct pharmacy for this prescription? Yes If no, delete pharmacy and type the correct one.   Has the prescription been filled recently? Yes  Is the patient out of the medication? Yes  Has the patient been seen for an appointment in the last year OR does the patient have an upcoming appointment? Yes  Can we respond through MyChart? Yes  Agent: Please be advised that Rx refills may take up to 3 business days. We ask that you follow-up with your pharmacy.

## 2024-11-27 MED ORDER — SERTRALINE HCL 50 MG PO TABS: 50.0000 mg | ORAL_TABLET | Freq: Every day | ORAL | 1 refills | Status: AC

## 2024-11-27 NOTE — Telephone Encounter (Signed)
 Rx refilled.
